# Patient Record
Sex: Male | Born: 1965 | Race: White | Hispanic: No | Marital: Single | Smoking: Never smoker
Health system: Southern US, Community
[De-identification: ages and names within clinical notes are randomized; demographics above are authoritative.]

## PROBLEM LIST (undated history)

## (undated) DIAGNOSIS — M1990 Unspecified osteoarthritis, unspecified site: Secondary | ICD-10-CM

## (undated) HISTORY — PX: ROTATOR CUFF REPAIR: SHX139

## (undated) HISTORY — PX: OTHER SURGICAL HISTORY: SHX169

## (undated) HISTORY — PX: LASIK: SHX215

## (undated) HISTORY — PX: TONSILLECTOMY: SUR1361

---

## 2001-05-03 ENCOUNTER — Emergency Department (HOSPITAL_COMMUNITY): Admission: EM | Admit: 2001-05-03 | Discharge: 2001-05-03 | Payer: Self-pay | Admitting: Emergency Medicine

## 2003-06-12 ENCOUNTER — Emergency Department (HOSPITAL_COMMUNITY): Admission: EM | Admit: 2003-06-12 | Discharge: 2003-06-12 | Payer: Self-pay | Admitting: Emergency Medicine

## 2005-10-20 ENCOUNTER — Encounter: Admission: RE | Admit: 2005-10-20 | Discharge: 2006-01-18 | Payer: Self-pay | Admitting: Physician Assistant

## 2007-06-27 ENCOUNTER — Ambulatory Visit: Payer: Self-pay | Admitting: Family Medicine

## 2007-08-22 HISTORY — PX: LAPAROSCOPIC GASTRIC BANDING: SHX1100

## 2007-10-01 ENCOUNTER — Ambulatory Visit (HOSPITAL_COMMUNITY): Admission: RE | Admit: 2007-10-01 | Discharge: 2007-10-01 | Payer: Self-pay | Admitting: General Surgery

## 2007-11-04 ENCOUNTER — Encounter: Admission: RE | Admit: 2007-11-04 | Discharge: 2007-11-04 | Payer: Self-pay | Admitting: General Surgery

## 2008-02-18 ENCOUNTER — Encounter: Admission: RE | Admit: 2008-02-18 | Discharge: 2008-02-18 | Payer: Self-pay | Admitting: General Surgery

## 2008-03-03 ENCOUNTER — Ambulatory Visit (HOSPITAL_COMMUNITY): Admission: RE | Admit: 2008-03-03 | Discharge: 2008-03-04 | Payer: Self-pay | Admitting: General Surgery

## 2010-01-17 ENCOUNTER — Emergency Department (HOSPITAL_COMMUNITY): Admission: EM | Admit: 2010-01-17 | Discharge: 2010-01-17 | Payer: Self-pay | Admitting: Emergency Medicine

## 2011-01-03 NOTE — Op Note (Signed)
NAME:  CODEN, FRANCHI NO.:  [id number]   MEDICAL RECORD NO.:  [id number]          PATIENT TYPE:  AMB   LOCATION:  DAY                          FACILITY:  [facility]   PHYSICIAN:  Sharlet Salina T. Hoxworth, M.D.DATE OF BIRTH:  12-Sep-1965   DATE OF PROCEDURE:  03/03/2008  DATE OF DISCHARGE:                               OPERATIVE REPORT   PREOPERATIVE DIAGNOSIS:  Morbid obesity.   POSTOPERATIVE DIAGNOSIS:  Morbid obesity.   SURGICAL PROCEDURES:  Placed of laparoscopic adjustable gastric band  with hiatal hernia repair.   SURGEON:  Lorne Skeens. Hoxworth, M.D.   ASSISTANT:  Thornton Park. Daphine Deutscher, M.D.   BRIEF HISTORY:  Mr. Hyndman is a 45 year old male with long history of  progressive morbid obesity unresponsive to medical management.  Following complete discussion workup detailed elsewhere, we elected  proceed with placement laparoscopic adjustable gastric band for  treatment of his morbid obesity.   DESCRIPTION OF OPERATION:  The patient brought to the operating room and  placed supine position on the operating table and general endotracheal  anesthesia was induced.  He received preoperative IV antibiotics.  Subcutaneous heparin was administered preoperatively.  PAS were in  place.  The abdomen was widely sterilely prepped and draped.  Correct  patient and procedure were verified.  Local anesthesia was used to  infiltrate the trocar sites.  Access was obtained in the left subcostal  area in the midclavicular line with an OptiVu trocar without difficulty  and pneumoperitoneum established.  Under direct vision, a 15 mm trocar  was placed in the right upper quadrant laterally and an 11 mm trocar in  the right upper mid abdomen, another 11 mm trocar to the left of the  umbilicus for camera port and 5 mm trocar placed in the left flank.  Through a 5-mm subxiphoid site, the Nathanson's retractor was placed and  the left lobe of liver elevated with excellent exposure of the  stomach  and the hiatus.  Initially the calibration tube was passed into the  stomach and the balloon inflated to 15 mL and pulled back.  This  retracted above a small hiatal hernia which was suspected based on the  upper GI series.  The gastrocolic omentum was divided along an avascular  area and the anterior border of the right crus was identified and  peritoneum opened here and dissected.  The retroesophageal space was  entered and the base of the right, left and crus dissected and the  esophagus and vagus nerve identified and there was a small to moderate-  sized hiatal hernia.  The posterior crural repair was then done with two  interrupted #0 Surgilon sutures.  Following this, the angle of His was  dissected dividing the peritoneum and dissecting along the upper portion  of left crus back posteriorly toward the retrogastric area with the  finger dissector.  Following this, an area just anterior to crossing fat  at the base of the right crus was dissected a little below the hiatal  hernia repair and the finger dissector passed into the space  retrogastric and deployed up through the  previously dissected area at  the angle of His without difficulty.  A flushed AP large lap band system  was introduced and the tubing passed into the finger dissector.  This  was brought back behind the stomach and the band passed back behind the  stomach without difficulty.  With the sizing tube placed back through  the EG junction, the band was snapped into place without any undue  tension.  The sizing tube was removed.  Holding the tubing inferiorly,  the fundus was imbricated up over the band to the small gastric pouch  with three interrupted 2-0 Ethibond sutures.  Band appeared be good  position and rotated freely.  The tubing was brought out through the  right mid abdominal port site and then the Nathanson's retractor removed  under direct vision.  All CO2 evacuated and trocars removed.  Right mid   abdominal incision was lengthened slightly and the anterior fascia  cleared.  Four 2-0 Prolene sutures were placed and then the tubing cut  and attached the port which was sutured to the anterior abdominal wall  with the sutures.  The tubing was seen to curve smoothly into the  abdomen.  The subcu was irrigated and the port site closed with running  3-0 Vicryl and skin was closed with subcuticular Monocryl and Dermabond.  Sponge, needle and instrument counts were correct.  The patient taken to  recovery in good condition.      Lorne Skeens. Hoxworth, M.D.  Electronically Signed     BTH/MEDQ  D:  03/03/2008  T:  03/03/2008  Job:  [id number]

## 2011-05-18 LAB — HEMOGLOBIN AND HEMATOCRIT, BLOOD: HCT: 46.8

## 2011-05-19 LAB — DIFFERENTIAL
Eosinophils Relative: 2
Lymphocytes Relative: 18
Lymphs Abs: 2

## 2011-05-19 LAB — CBC
HCT: 41.6
Platelets: 275
WBC: 11.3 — ABNORMAL HIGH

## 2011-08-16 ENCOUNTER — Ambulatory Visit (INDEPENDENT_AMBULATORY_CARE_PROVIDER_SITE_OTHER): Payer: BC Managed Care – PPO | Admitting: Medical

## 2011-08-16 ENCOUNTER — Encounter: Payer: Self-pay | Admitting: Medical

## 2011-08-16 VITALS — BP 112/80 | HR 62 | Temp 98.0°F | Resp 16 | Wt 279.0 lb

## 2011-08-16 DIAGNOSIS — L03317 Cellulitis of buttock: Secondary | ICD-10-CM

## 2011-08-16 DIAGNOSIS — L0231 Cutaneous abscess of buttock: Secondary | ICD-10-CM | POA: Insufficient documentation

## 2011-08-16 MED ORDER — DOXYCYCLINE HYCLATE 100 MG PO TABS: 100.0000 mg | ORAL_TABLET | Freq: Two times a day (BID) | ORAL | Status: AC

## 2011-08-16 NOTE — Patient Instructions (Signed)

## 2011-08-16 NOTE — Progress Notes (Signed)
Subjective:   HPI  Bobby James is a 45 y.o. male who presents with possible cysts on his buttocks. He note 2 areas that flare up intermittently for the last year or so, occasionally they drain.  He denies hx/o MRSA, and has never been seen for this reason prior.  The lesions have been draining and staining his clothes for the past several days.  No other aggravating or relieving factors.    No other c/o.  The following portions of the patient's history were reviewed and updated as appropriate: allergies, current medications, past family history, past medical history, past social history, past surgical history and problem list.  No significant medical history.   Review of Systems Constitutional: -fever, -chills, -sweats, -unexpected -weight change,-fatigue ENT: -runny nose, -ear pain, -sore throat Cardiology:  -chest pain, -palpitations, -edema Respiratory: -cough, -shortness of breath, -wheezing Gastroenterology: -abdominal pain, -nausea, -vomiting, -diarrhea, -constipation Hematology: -bleeding or bruising problems Musculoskeletal: -arthralgias, -myalgias, -joint swelling, -back pain Ophthalmology: -vision changes Urology: -dysuria, -difficulty urinating, -hematuria, -urinary frequency, -urgency Neurology: -headache, -weakness, -tingling, -numbness    Objective:   Physical Exam  Filed Vitals:   08/16/11 1625  BP: 112/80  Pulse: 62  Temp: 98 F (36.7 C)  Resp: 16    General appearance: alert, no distress, WD/WN Skin: right buttock with 2 lesions along gluteal cleft, 1 lesion is 3cm x 1 cm with erythema and pus draining at current, but no warmth or induration, and second smaller 1cm diameter lesion that is slightly erythematous, but no induration, fluctuance or warmth  Assessment and Plan :    Encounter Diagnosis  Name Primary?  Marland Kitchen Abscess of buttock Yes   The 2 lesions are small and current draining.  Advised hot epsom salt bath soaks, practice good hygiene, begin  Doxycycline, and recheck in 10-12 days, sooner prn.

## 2011-09-21 ENCOUNTER — Telehealth (INDEPENDENT_AMBULATORY_CARE_PROVIDER_SITE_OTHER): Payer: Self-pay | Admitting: General Surgery

## 2011-09-21 NOTE — Telephone Encounter (Signed)
09/21/11 recall letter mailed to patient for bariatric surgery follow-up. Adv pt to call our office at [phone] to schedule an appointment. CEF °

## 2012-03-01 ENCOUNTER — Encounter (INDEPENDENT_AMBULATORY_CARE_PROVIDER_SITE_OTHER): Payer: Self-pay | Admitting: General Surgery

## 2012-03-01 ENCOUNTER — Ambulatory Visit (INDEPENDENT_AMBULATORY_CARE_PROVIDER_SITE_OTHER): Payer: BC Managed Care – PPO | Admitting: General Surgery

## 2012-03-01 VITALS — BP 136/84 | HR 71 | Temp 97.2°F | Resp 16 | Ht 69.0 in | Wt 279.6 lb

## 2012-03-01 DIAGNOSIS — L02219 Cutaneous abscess of trunk, unspecified: Secondary | ICD-10-CM

## 2012-03-01 DIAGNOSIS — L02215 Cutaneous abscess of perineum: Secondary | ICD-10-CM

## 2012-05-13 ENCOUNTER — Encounter (HOSPITAL_COMMUNITY): Payer: Self-pay | Admitting: Pharmacy Technician

## 2012-05-21 ENCOUNTER — Encounter (HOSPITAL_COMMUNITY): Payer: Self-pay

## 2012-05-21 ENCOUNTER — Encounter (HOSPITAL_COMMUNITY)
Admission: RE | Admit: 2012-05-21 | Discharge: 2012-05-21 | Disposition: A | Discharge status: Home / Self Care | Payer: BC Managed Care – PPO | Source: Ambulatory Visit | Attending: General Surgery | Admitting: General Surgery

## 2012-05-21 LAB — CBC
Hemoglobin: 15.6 g/dL (ref 13.0–17.0)
MCH: 28.1 pg (ref 26.0–34.0)
RBC: 5.56 MIL/uL (ref 4.22–5.81)
WBC: 9.7 10*3/uL (ref 4.0–10.5)

## 2012-05-21 LAB — SURGICAL PCR SCREEN
MRSA, PCR: NEGATIVE
Staphylococcus aureus: POSITIVE — AB

## 2012-05-21 NOTE — Patient Instructions (Signed)
20      Your procedure is scheduled on:  Wednesday 05/22/2012 at 0830 am  Report to [facility] at 0630 AM.  Call this number if you have problems the morning of surgery: [phone]   Remember:   Do not eat food or drink liquids after midnight!  Take these medicines the morning of surgery with A SIP OF WATER: NONE   Do not bring valuables to the hospital.  .  Leave suitcase in the car. After surgery it may be brought to your room.  For patients admitted to the hospital, checkout time is 11:00 AM the day of              Discharge.    Special Instructions: See [facility] Preparing  For Surgery Instruction Sheet.  Do not wear jewelry, lotions powders, perfumes. Women do not shave  legs or underarms for 12 hours before showers. Contacts, partial plates, or dentures may not be worn into surgery.                          Patients discharged the day of surgery will not be allowed to drive home.  If going home the same day of surgery, must have someone stay with you first 24 hrs.at home and arrange for someone to drive you home from the              Hospital.   Please read over the following fact sheets that you were given: MRSA              INFORMATION, Sleep apnea sheet, Incentive Spirometry sheet               Telford Nab.Eleyna Brugh,RN,BSN [phone]

## 2012-05-22 ENCOUNTER — Encounter (HOSPITAL_COMMUNITY)
Admission: RE | Disposition: A | Discharge status: Home / Self Care | Payer: Self-pay | Source: Ambulatory Visit | Attending: General Surgery

## 2012-05-22 ENCOUNTER — Encounter (HOSPITAL_COMMUNITY): Payer: Self-pay | Admitting: Anesthesiology

## 2012-05-22 ENCOUNTER — Encounter (HOSPITAL_COMMUNITY): Payer: Self-pay | Admitting: *Deleted

## 2012-05-22 ENCOUNTER — Observation Stay (HOSPITAL_COMMUNITY)
Admission: RE | Admit: 2012-05-22 | Discharge: 2012-05-23 | Disposition: A | Discharge status: Home / Self Care | Payer: BC Managed Care – PPO | Source: Ambulatory Visit | Attending: General Surgery | Admitting: General Surgery

## 2012-05-22 ENCOUNTER — Ambulatory Visit (HOSPITAL_COMMUNITY): Payer: BC Managed Care – PPO | Admitting: Anesthesiology

## 2012-05-22 DIAGNOSIS — L0231 Cutaneous abscess of buttock: Secondary | ICD-10-CM

## 2012-05-22 DIAGNOSIS — Z9884 Bariatric surgery status: Secondary | ICD-10-CM | POA: Insufficient documentation

## 2012-05-22 DIAGNOSIS — L02219 Cutaneous abscess of trunk, unspecified: Principal | ICD-10-CM | POA: Insufficient documentation

## 2012-05-22 DIAGNOSIS — Z01812 Encounter for preprocedural laboratory examination: Secondary | ICD-10-CM | POA: Insufficient documentation

## 2012-05-22 DIAGNOSIS — L723 Sebaceous cyst: Secondary | ICD-10-CM

## 2012-05-22 DIAGNOSIS — L03319 Cellulitis of trunk, unspecified: Secondary | ICD-10-CM | POA: Insufficient documentation

## 2012-05-22 DIAGNOSIS — K612 Anorectal abscess: Secondary | ICD-10-CM

## 2012-05-22 HISTORY — PX: INCISION AND DRAINAGE PERIRECTAL ABSCESS: SHX1804

## 2012-05-22 SURGERY — INCISION AND DRAINAGE, ABSCESS, PERIRECTAL
Anesthesia: General | Site: Anus | Wound class: Dirty or Infected

## 2012-05-22 MED ORDER — ACETAMINOPHEN 10 MG/ML IV SOLN
INTRAVENOUS | Status: DC | PRN
  Administered 2012-05-22: 1000 mg via INTRAVENOUS

## 2012-05-22 MED ORDER — CEFAZOLIN SODIUM-DEXTROSE 2-3 GM-% IV SOLR
INTRAVENOUS | Status: AC
  Filled 2012-05-22: qty 50

## 2012-05-22 MED ORDER — FENTANYL CITRATE 0.05 MG/ML IJ SOLN
INTRAMUSCULAR | Status: DC | PRN
  Administered 2012-05-22: 100 ug via INTRAVENOUS

## 2012-05-22 MED ORDER — POTASSIUM CHLORIDE IN NACL 20-0.9 MEQ/L-% IV SOLN
INTRAVENOUS | Status: DC
  Administered 2012-05-22: 1000 mL via INTRAVENOUS
  Administered 2012-05-23: 06:00:00 via INTRAVENOUS
  Filled 2012-05-22 (×2): qty 1000

## 2012-05-22 MED ORDER — BUPIVACAINE LIPOSOME 1.3 % IJ SUSP
20.0000 mL | Freq: Once | INTRAMUSCULAR | Status: DC
  Filled 2012-05-22: qty 20

## 2012-05-22 MED ORDER — HEPARIN SODIUM (PORCINE) 5000 UNIT/ML IJ SOLN
5000.0000 [IU] | Freq: Three times a day (TID) | INTRAMUSCULAR | Status: DC
  Administered 2012-05-22 – 2012-05-23 (×2): 5000 [IU] via SUBCUTANEOUS
  Filled 2012-05-22 (×5): qty 1

## 2012-05-22 MED ORDER — DIBUCAINE 1 % RE OINT
TOPICAL_OINTMENT | RECTAL | Status: AC
  Filled 2012-05-22: qty 28

## 2012-05-22 MED ORDER — ONDANSETRON HCL 4 MG/2ML IJ SOLN
INTRAMUSCULAR | Status: DC | PRN
  Administered 2012-05-22: 4 mg via INTRAVENOUS

## 2012-05-22 MED ORDER — DEXTROSE 5 % IV SOLN
3.0000 g | INTRAVENOUS | Status: AC
  Administered 2012-05-22: 3 g via INTRAVENOUS

## 2012-05-22 MED ORDER — ONDANSETRON HCL 4 MG PO TABS: 4.0000 mg | ORAL_TABLET | Freq: Four times a day (QID) | ORAL | Status: DC | PRN

## 2012-05-22 MED ORDER — OXYCODONE-ACETAMINOPHEN 5-325 MG PO TABS
1.0000 | ORAL_TABLET | ORAL | Status: DC | PRN
  Administered 2012-05-23: 2 via ORAL
  Filled 2012-05-22: qty 2

## 2012-05-22 MED ORDER — PROMETHAZINE HCL 25 MG/ML IJ SOLN: 6.2500 mg | INTRAMUSCULAR | Status: DC | PRN

## 2012-05-22 MED ORDER — FENTANYL CITRATE 0.05 MG/ML IJ SOLN
INTRAMUSCULAR | Status: AC
  Filled 2012-05-22: qty 2

## 2012-05-22 MED ORDER — MORPHINE SULFATE 2 MG/ML IJ SOLN
2.0000 mg | INTRAMUSCULAR | Status: DC | PRN
  Administered 2012-05-22: 4 mg via INTRAVENOUS
  Administered 2012-05-22: 2 mg via INTRAVENOUS
  Administered 2012-05-22: 4 mg via INTRAVENOUS
  Filled 2012-05-22: qty 2
  Filled 2012-05-22: qty 1
  Filled 2012-05-22: qty 2

## 2012-05-22 MED ORDER — BUPIVACAINE LIPOSOME 1.3 % IJ SUSP
INTRAMUSCULAR | Status: DC | PRN
  Administered 2012-05-22: 20 mL

## 2012-05-22 MED ORDER — KETOROLAC TROMETHAMINE 30 MG/ML IJ SOLN: 15.0000 mg | Freq: Once | INTRAMUSCULAR | Status: DC | PRN

## 2012-05-22 MED ORDER — LACTATED RINGERS IV SOLN
INTRAVENOUS | Status: DC | PRN
  Administered 2012-05-22: 08:00:00 via INTRAVENOUS

## 2012-05-22 MED ORDER — FENTANYL CITRATE 0.05 MG/ML IJ SOLN
25.0000 ug | INTRAMUSCULAR | Status: DC | PRN
  Administered 2012-05-22 (×3): 50 ug via INTRAVENOUS

## 2012-05-22 MED ORDER — CEFAZOLIN SODIUM 1-5 GM-% IV SOLN
INTRAVENOUS | Status: AC
  Filled 2012-05-22: qty 50

## 2012-05-22 MED ORDER — ACETAMINOPHEN 10 MG/ML IV SOLN
INTRAVENOUS | Status: AC
  Filled 2012-05-22: qty 100

## 2012-05-22 MED ORDER — MUPIROCIN 2 % EX OINT
TOPICAL_OINTMENT | Freq: Two times a day (BID) | CUTANEOUS | Status: DC
  Administered 2012-05-22: 1 via NASAL
  Filled 2012-05-22 (×2): qty 22

## 2012-05-22 MED ORDER — BUPIVACAINE-EPINEPHRINE PF 0.25-1:200000 % IJ SOLN
INTRAMUSCULAR | Status: AC
  Filled 2012-05-22: qty 30

## 2012-05-22 MED ORDER — POTASSIUM CHLORIDE IN NACL 20-0.9 MEQ/L-% IV SOLN
INTRAVENOUS | Status: AC
  Filled 2012-05-22: qty 1000

## 2012-05-22 MED ORDER — INFLUENZA VIRUS VACC SPLIT PF IM SUSP
0.5000 mL | INTRAMUSCULAR | Status: AC
  Administered 2012-05-23: 0.5 mL via INTRAMUSCULAR
  Filled 2012-05-22: qty 0.5

## 2012-05-22 MED ORDER — LIDOCAINE HCL (CARDIAC) 20 MG/ML IV SOLN
INTRAVENOUS | Status: DC | PRN
  Administered 2012-05-22: 50 mg via INTRAVENOUS

## 2012-05-22 MED ORDER — PROPOFOL 10 MG/ML IV BOLUS
INTRAVENOUS | Status: DC | PRN
  Administered 2012-05-22: 180 mg via INTRAVENOUS

## 2012-05-22 MED ORDER — ONDANSETRON HCL 4 MG/2ML IJ SOLN: 4.0000 mg | Freq: Four times a day (QID) | INTRAMUSCULAR | Status: DC | PRN

## 2012-05-22 SURGICAL SUPPLY — 30 items
BLADE HEX COATED 2.75 (ELECTRODE) ×2 IMPLANT
BLADE SURG 15 STRL LF DISP TIS (BLADE) ×1 IMPLANT
BLADE SURG 15 STRL SS (BLADE) ×1
CANISTER SUCTION 2500CC (MISCELLANEOUS) ×2 IMPLANT
CLOTH BEACON ORANGE TIMEOUT ST (SAFETY) ×2 IMPLANT
COVER SURGICAL LIGHT HANDLE (MISCELLANEOUS) IMPLANT
DRSG PAD ABDOMINAL 8X10 ST (GAUZE/BANDAGES/DRESSINGS) ×2 IMPLANT
ELECT REM PT RETURN 9FT ADLT (ELECTROSURGICAL) ×2
ELECTRODE REM PT RTRN 9FT ADLT (ELECTROSURGICAL) ×1 IMPLANT
GAUZE SPONGE 4X4 16PLY XRAY LF (GAUZE/BANDAGES/DRESSINGS) ×2 IMPLANT
GLOVE BIOGEL PI IND STRL 7.0 (GLOVE) ×3 IMPLANT
GLOVE BIOGEL PI INDICATOR 7.0 (GLOVE) ×3
GOWN STRL NON-REIN LRG LVL3 (GOWN DISPOSABLE) ×2 IMPLANT
GOWN STRL REIN XL XLG (GOWN DISPOSABLE) ×2 IMPLANT
KIT BASIN OR (CUSTOM PROCEDURE TRAY) ×2 IMPLANT
LUBRICANT JELLY K Y 4OZ (MISCELLANEOUS) ×2 IMPLANT
NEEDLE HYPO 25X1 1.5 SAFETY (NEEDLE) IMPLANT
PACK LITHOTOMY IV (CUSTOM PROCEDURE TRAY) ×2 IMPLANT
PENCIL BUTTON HOLSTER BLD 10FT (ELECTRODE) ×2 IMPLANT
SOL PREP PROV IODINE SCRUB 4OZ (MISCELLANEOUS) ×2 IMPLANT
SPONGE GAUZE 4X4 12PLY (GAUZE/BANDAGES/DRESSINGS) IMPLANT
SPONGE LAP 18X18 X RAY DECT (DISPOSABLE) ×2 IMPLANT
SPONGE LAP 4X18 X RAY DECT (DISPOSABLE) ×2 IMPLANT
SUT ETHILON 4 0 PS 2 18 (SUTURE) ×2 IMPLANT
SUT VIC AB 4-0 SH 18 (SUTURE) ×2 IMPLANT
SWAB COLLECTION DEVICE MRSA (MISCELLANEOUS) IMPLANT
SYR CONTROL 10ML LL (SYRINGE) ×2 IMPLANT
TOWEL OR 17X26 10 PK STRL BLUE (TOWEL DISPOSABLE) ×2 IMPLANT
UNDERPAD 30X30 INCONTINENT (UNDERPADS AND DIAPERS) ×2 IMPLANT
YANKAUER SUCT BULB TIP 10FT TU (MISCELLANEOUS) ×2 IMPLANT

## 2012-05-22 NOTE — H&P (Signed)
  Subjective:   Chronic infection and drainage of perineum  Patient ID: Bobby James, male DOB: 16-Mar-1966, 46 y.o. MRN: [id number]  HPI  Patient well known to me from LAP-BAND placement in 2009 is referred do to a chronic infection on his perineum. He states that for at least a year and a half he has had intermittent swelling and discomfort as well as bloody and purulent drainage from the right side of his perineum extending from the scrotum back toward the anus. This is been treated with multiple courses of antibiotics and topical care with just gradual worsening of the process. It causes daily drainage and occasional discomfort. He has not had any previous surgical procedures in the area. He has no specific anorectal symptoms.  No past medical history on file.  Past Surgical History   Procedure  Date   .  Laparoscopic gastric banding  2009    No current outpatient prescriptions on file.    No Known Allergies  Review of Systems  Constitutional: Negative.  Respiratory: Negative.  Cardiovascular: Negative.  Gastrointestinal: Negative.    Objective:   Physical Exam  General: Overweight Caucasian male in no distress  Skin: Warm and dry. see perineum  Lungs: Clear without wheezing or increased work of breathing  Cardiac: Regular rate and rhythm without murmurs. No edema  Abdomen: Soft and nontender. Status post lap band port site unremarkable.  GU/perineum: To the right side of the perineum extending anterior to posterior from the base of the scrotum to the right perianal area is a 15 cm area of induration tubular shaped with several chronic draining sinus tracts to the skin. There does not appear to be any extension over toward the anus.  Extremities: No joint swelling deformity or edema  Neurologic: Alert and oriented. Gait normal.   Assessment:    Chronic perineal abscess and sinus tract. 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 We discussed surgical options. This whole tract would have to be completely excised which measures 15 cm in length and would be a large wound with some significant initial disability and healing time. We discussed the wound would need to be left open. We discussed extensive nature of the surgery and risks of bleeding, infection, delayed healing, and recurrent infection. The patient feels he really cannot continue like he is desirous to proceed with surgical intervention.   Plan:    Excision of chronic perineal abscess under general anesthesia with overnight hospitalization.

## 2012-05-22 NOTE — Progress Notes (Signed)
Vitals done at 1310

## 2012-05-22 NOTE — Anesthesia Preprocedure Evaluation (Signed)
Anesthesia Evaluation  Patient identified by MRN, date of birth, ID band Patient awake    Reviewed: Allergy & Precautions, H&P , NPO status , Patient's Chart, lab work & pertinent test results  Airway Mallampati: III TM Distance: <3 FB Neck ROM: Full    Dental No notable dental hx.    Pulmonary neg pulmonary ROS,  breath sounds clear to auscultation  + decreased breath sounds      Cardiovascular negative cardio ROS  Rhythm:Regular Rate:Normal     Neuro/Psych negative neurological ROS  negative psych ROS   GI/Hepatic negative GI ROS, Neg liver ROS,   Endo/Other  Morbid obesity  Renal/GU negative Renal ROS  negative genitourinary   Musculoskeletal negative musculoskeletal ROS (+)   Abdominal   Peds negative pediatric ROS (+)  Hematology negative hematology ROS (+)   Anesthesia Other Findings   Reproductive/Obstetrics negative OB ROS                           Anesthesia Physical Anesthesia Plan  ASA: III  Anesthesia Plan: General   Post-op Pain Management:    Induction: Intravenous  Airway Management Planned: LMA and Oral ETT  Additional Equipment:   Intra-op Plan:   Post-operative Plan:   Informed Consent: I have reviewed the patients History and Physical, chart, labs and discussed the procedure including the risks, benefits and alternatives for the proposed anesthesia with the patient or authorized representative who has indicated his/her understanding and acceptance.   Dental advisory given  Plan Discussed with: CRNA and Surgeon  Anesthesia Plan Comments:         Anesthesia Quick Evaluation

## 2012-05-22 NOTE — Transfer of Care (Signed)
Immediate Anesthesia Transfer of Care Note  Patient: Bobby James  Procedure(s) Performed: Procedure(s) (LRB) with comments: IRRIGATION AND DEBRIDEMENT PERIRECTAL ABSCESS (N/A) - Excision Perineal Abscess  Patient Location: PACU  Anesthesia Type: General  Level of Consciousness: awake, alert  and oriented  Airway & Oxygen Therapy: Patient Spontanous Breathing and Patient connected to face mask oxygen  Post-op Assessment: Report given to PACU RN  Post vital signs: Reviewed and stable  Complications: No apparent anesthesia complications

## 2012-05-22 NOTE — Op Note (Signed)
Preoperative Diagnosis: perineal abscess  Postoprative Diagnosis: perineal abscess  Procedure: Procedure(s): EXCISION CHRONIC PERINEAL ABSCESS   Surgeon: Glenna Fellows T   Assistants: None  Anesthesia:  General endotracheal anesthesia  Indications:   Patient is a 46 year old male with a long history of a persistent area of purulent drainage and discomfort on the right side of his perineum. Examination has revealed a large area of chronic soft tissue infection extending from the base of the scrotum down the right side of the perineum from anterior and posterior to the posterior right buttock. There are several draining sinus tracts with purulent drainage and a sausage-shaped area of induration which measures approximately 12 x 5 cm. This is consistent with hidradenitis and does not seem to extend toward the anus. Due to persistent and worsening symptoms after discussion of options in the office we elected to proceed with excision of this area. We discussed that the wound will be left open. Complications including anesthetic complications, bleeding, infection, delayed wound healing and recurrence have been discussed and understood.  Procedure Detail:  Patient is brought to the operating room, placed in supine position on the operating table, and general ventricular anesthesia was induced. He was carefully positioned in lithotomy position and the perineum widely sterilely prepped and draped. He received preoperative IV antibiotics. Correct patient and procedure were verified. Examination was as above. I passed a probe into several of the sinus tracts and none of them appeared to communicate over toward the anus at all down into this complex chronic abscess. An elliptical excision was performed anterior to posterior encompassing all the sinus tracts indurated area. This extended into the deep subcutaneous tissue and down to the fascia at the base of the scrotum. A couple of areas superiorly and  medially I did come across the abscess cavity with cautery and this was well-defined and thick walled with granulation tissue. I went back and completely excised this back to healthy subcutaneous tissue. The entire process was completely removed. Hemostasis was obtained with cautery and several figure-of-eight sutures of 4-0 Vicryl. The soft tissue was infiltrated with 20 cc of Exparel local anesthetic. I then partially closed the wound with 2 mattress sutures of interrupted 4-0 nylon creating basically free open wounds which were then packed with moist saline gauze. Sponge needle was were correct. Drawstring dressing was applied.   Estimated Blood Loss:  less than 50 mL                 Specimens: the skin and subcutaneous tissue containing chronic abscess cavity        Complications:  * No complications entered in OR log *         Disposition: PACU - hemodynamically stable.         Condition: stable Mariella Saa MD, FACS  05/22/2012, 9:34 AM

## 2012-05-22 NOTE — Anesthesia Procedure Notes (Signed)
Procedure Name: LMA Insertion Date/Time: 05/22/2012 8:40 AM Performed by: Hulan Fess Pre-anesthesia Checklist: Patient identified, Emergency Drugs available, Suction available, Patient being monitored and Timeout performed Patient Re-evaluated:Patient Re-evaluated prior to inductionOxygen Delivery Method: Circle system utilized and Simple face mask Preoxygenation: Pre-oxygenation with 100% oxygen Intubation Type: IV induction Ventilation: Mask ventilation without difficulty LMA: LMA with gastric port inserted LMA Size: 4.0

## 2012-05-22 NOTE — Anesthesia Postprocedure Evaluation (Signed)
  Anesthesia Post-op Note  Patient: Bobby James  Procedure(s) Performed: Procedure(s) (LRB): IRRIGATION AND DEBRIDEMENT PERIRECTAL ABSCESS (N/A)  Patient Location: PACU  Anesthesia Type: General  Level of Consciousness: awake and alert   Airway and Oxygen Therapy: Patient Spontanous Breathing  Post-op Pain: mild  Post-op Assessment: Post-op Vital signs reviewed, Patient's Cardiovascular Status Stable, Respiratory Function Stable, Patent Airway and No signs of Nausea or vomiting  Post-op Vital Signs: stable  Complications: No apparent anesthesia complications

## 2012-05-23 ENCOUNTER — Encounter (HOSPITAL_COMMUNITY): Payer: Self-pay | Admitting: General Surgery

## 2012-05-23 LAB — CBC
MCH: 29.1 pg (ref 26.0–34.0)
MCV: 82.1 fL (ref 78.0–100.0)
Platelets: 252 10*3/uL (ref 150–400)
RDW: 12.6 % (ref 11.5–15.5)
WBC: 10.2 10*3/uL (ref 4.0–10.5)

## 2012-05-23 LAB — BASIC METABOLIC PANEL
Calcium: 8.7 mg/dL (ref 8.4–10.5)
Creatinine, Ser: 1 mg/dL (ref 0.50–1.35)
GFR calc Af Amer: >90 mL/min (ref >90)
GFR calc non Af Amer: 87 mL/min — ABNORMAL LOW (ref >90)

## 2012-05-23 MED ORDER — OXYCODONE-ACETAMINOPHEN 5-325 MG PO TABS: 1.0000 | ORAL_TABLET | ORAL | Status: DC | PRN

## 2012-05-23 NOTE — Discharge Summary (Signed)
   Patient ID: ESCHER HARR [id number] 46 y.o. 1966/03/22  05/22/2012  Discharge date and time: 05/23/2012   Admitting Physician: Glenna Fellows T  Discharge Physician: Glenna Fellows T  Admission Diagnoses: perineal abscess  Discharge Diagnoses: Same  Operations: Procedure(s): IRRIGATION AND DEBRIDEMENT PERIRECTAL ABSCESS  Admission Condition: good  Discharged Condition: good  Indication for Admission: patient has a long history of gradually enlarging persistent area of induration with multiple draining sinuses along the right side of his perineum from the base of the scrotum to the right perirectal area. This does not appear to communicate with the anus. After extensive discussion about options and risks detailed elsewhere he is admitted for excision of his chronic perineal abscess  Hospital Course: on the morning of admission the patient underwent excision of a 15 x 5 cm chronic abscess of the perineum with multiple fistulas to the skin which appeared most consistent with chronic retinitis. There was no apparent communication to the rectum. Postoperatively he did well without much pain. He tolerated his wound repacking on the first postop day without difficulty and his wound was clean and without bleeding. He will return to the office tomorrow for repacking.   Disposition: Home  Patient Instructions:   [place], [place]  Home Medication Instructions [id number]   Printed on:05/23/12 9562  Medication Information                    oxyCODONE-acetaminophen (PERCOCET/ROXICET) 5-325 MG per tablet Take 1-2 tablets by mouth every 4 (four) hours as needed.             Activity: activity as tolerated Diet: LAP-BAND diet Wound Care: as directed   Follow-up:  With Dr. Johna Sheriff in 1 day.  Signed: Mariella Saa MD, FACS  05/23/2012, 7:17 AM

## 2012-05-23 NOTE — Progress Notes (Signed)
Dr. Johna Sheriff aware via phone pt's  packing came out while preparing to go home. Order received to replace packing prior to dc.

## 2012-05-23 NOTE — Care Management Note (Signed)
    Page 1 of 1   05/23/2012     1:35:27 PM   CARE MANAGEMENT NOTE 05/23/2012  Patient:  Bobby James, Bobby James   Account Number:  [id number]  Date Initiated:  05/23/2012  Documentation initiated by:  Lorenda Ishihara  Subjective/Objective Assessment:   46 yo male admitted with perineal abscess, I&D in OR. PTA lived at home with spouse.     Action/Plan:   Anticipated DC Date:  05/23/2012   Anticipated DC Plan:  HOME/SELF CARE      DC Planning Services  CM consult      Choice offered to / List presented to:             Status of service:  Completed, signed off Medicare Important Message given?   (If response is "NO", the following Medicare IM given date fields will be blank) Date Medicare IM given:   Date Additional Medicare IM given:    Discharge Disposition:  HOME/SELF CARE  Per UR Regulation:  Reviewed for med. necessity/level of care/duration of stay  If discussed at Long Length of Stay Meetings, dates discussed:    Comments:

## 2012-05-23 NOTE — Progress Notes (Signed)
Assessment unchanged.  IV removed.  Discussed D/C instructions with pt including f/u appointment and wound care instructions. Pt verbalized understanding.  Paperwork and Rx given to pt.  Pt did not have any questions.  Pt left via W/C accompanied by NT.

## 2012-05-23 NOTE — Progress Notes (Signed)
Patient ID: Bobby James, male   DOB: 1966-08-12, 46 y.o.   MRN: [id number] 1 Day Post-Op  Subjective: No complaints. Minimal discomfort.  Objective: Vital signs in last 24 hours: Temp:  [97.5 F (36.4 C)-98.9 F (37.2 C)] 98.5 F (36.9 C) (10/03 0530) Pulse Rate:  [56-81] 65  (10/03 0530) Resp:  [11-21] 18  (10/03 0530) BP: (126-176)/(57-107) 129/68 mmHg (10/03 0530) SpO2:  [95 %-100 %] 95 % (10/03 0530) Weight:  [279 lb (126.554 kg)] 279 lb (126.554 kg) (10/02 1210) Last BM Date: 05/22/12  Intake/Output from previous day: 10/02 0701 - 10/03 0700 In: 2259.2 [P.O.:240; I.V.:2019.2] Out: 1250 [Urine:1250] Intake/Output this shift: Total I/O In: 969.2 [I.V.:969.2] Out: 400 [Urine:400]  General appearance: alert and no distress Incision/Wound: clean and no bleeding.  Lab Results:   Basename 05/23/12 0445 05/21/12 0910  WBC 10.2 9.7  HGB 14.6 15.6  HCT 41.2 45.1  PLT 252 295   BMET No results found for this basename: NA:2,K:2,CL:2,CO2:2,GLUCOSE:2,BUN:2,CREATININE:2,CALCIUM:2 in the last 72 hours   Studies/Results: No results found.  Anti-infectives: Anti-infectives     Start     Dose/Rate Route Frequency Ordered Stop   05/22/12 0708   ceFAZolin (ANCEF) 3 g in dextrose 5 % 50 mL IVPB        3 g 160 mL/hr over 30 Minutes Intravenous 60 min pre-op 05/22/12 0708 05/22/12 0830          Assessment/Plan: s/p Procedure(s): EXCISION OF PERINEAL ABSCESS Wound repacked. Doing well OK for discharge, F/U office tomorrow for wound packing   LOS: 1 day    Enna Warwick T 05/23/2012

## 2012-05-24 ENCOUNTER — Ambulatory Visit (INDEPENDENT_AMBULATORY_CARE_PROVIDER_SITE_OTHER): Payer: BC Managed Care – PPO | Admitting: General Surgery

## 2012-05-24 VITALS — BP 148/88 | HR 82 | Temp 97.5°F | Resp 18 | Ht 69.0 in | Wt 281.4 lb

## 2012-05-24 DIAGNOSIS — L0231 Cutaneous abscess of buttock: Secondary | ICD-10-CM

## 2012-05-24 NOTE — Progress Notes (Signed)
History: Patient returns several days following removal of an extensive chronic subcutaneous abscess of the perineum probably secondary to hidradenitis. The wound was loosely closed with 2 nylon sutures and the rest packed open. He comes in for a dressing change. He several remarkably little pain and no other complaints.  Exam: The wound which overall measures about 15 cm in length in the right side of the perineum is clean without unusual drainage or bleeding. It was repacked. He will remove the packing tomorrow and then just begin moist saline dressing changes and keep the wound covered and clean. The 2 nylon sutures will be removed at the end of next week.

## 2012-05-30 ENCOUNTER — Ambulatory Visit (INDEPENDENT_AMBULATORY_CARE_PROVIDER_SITE_OTHER): Payer: BC Managed Care – PPO | Admitting: General Surgery

## 2012-05-30 ENCOUNTER — Encounter (INDEPENDENT_AMBULATORY_CARE_PROVIDER_SITE_OTHER): Payer: Self-pay

## 2012-05-30 VITALS — BP 106/78 | HR 87 | Temp 97.4°F | Resp 16 | Ht 69.0 in | Wt 279.4 lb

## 2012-05-30 DIAGNOSIS — Z4802 Encounter for removal of sutures: Secondary | ICD-10-CM

## 2012-05-30 NOTE — Progress Notes (Signed)
Patient came in for suture removal.  Patient s/p Perineal abscess excision on 05/22/12.  Sutures removed, dry gauze placed over wound.  Patient  Seen in office on 05/24/12 with Dr. Johna Sheriff and advised to come in on 05/30/12 for nurse visit.      Dr. Abbey Chatters examined wound prior to suture removal, advised to remove remaining sutures and placed dry gauze over wound.  Patient has follow up on 06/14/12 w/Dr. Owens Shark.  Nurse visit with Maryan Puls & June Leap.

## 2012-06-14 ENCOUNTER — Encounter (INDEPENDENT_AMBULATORY_CARE_PROVIDER_SITE_OTHER): Payer: BC Managed Care – PPO | Admitting: General Surgery

## 2012-08-02 ENCOUNTER — Ambulatory Visit (INDEPENDENT_AMBULATORY_CARE_PROVIDER_SITE_OTHER): Payer: BC Managed Care – PPO | Admitting: General Surgery

## 2012-08-02 ENCOUNTER — Encounter (INDEPENDENT_AMBULATORY_CARE_PROVIDER_SITE_OTHER): Payer: Self-pay | Admitting: General Surgery

## 2012-08-02 VITALS — BP 132/76 | HR 76 | Temp 98.2°F | Resp 18 | Ht 69.0 in | Wt 275.6 lb

## 2012-08-02 DIAGNOSIS — Z09 Encounter for follow-up examination after completed treatment for conditions other than malignant neoplasm: Secondary | ICD-10-CM

## 2012-08-02 NOTE — Progress Notes (Signed)
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 He happily reports it is completely healed.  In regards to his lap band he's been exercising we'll do more and still feels significant restriction and his weight is actually down approximately 11 pounds from several months ago.  Exam: BP 132/76  Pulse 76  Temp 98.2 F (36.8 C) (Temporal)  Resp 18  Ht 5\' 9"  (1.753 m)  Wt 275 lb 9.6 oz (125.011 kg)  BMI 40.70 kg/m2 Total weight loss and 50 pounds General: Appears well Perineum: The incision is completely healed without any skin openings were induration or other concerns.  Assessment and plan: Doing well with healed wound post abscess excision. He's doing well with his lap band and is planning on increasing his exercise regimen. I asked him to call in about 6 months for followup for his lap band.

## 2013-07-03 ENCOUNTER — Encounter: Payer: Self-pay | Admitting: Internal Medicine

## 2016-10-02 ENCOUNTER — Encounter (HOSPITAL_COMMUNITY): Payer: Self-pay

## 2017-10-05 ENCOUNTER — Encounter (HOSPITAL_COMMUNITY): Payer: Self-pay

## 2020-03-30 ENCOUNTER — Other Ambulatory Visit: Payer: Self-pay | Admitting: Sports Medicine

## 2020-03-30 DIAGNOSIS — M25561 Pain in right knee: Secondary | ICD-10-CM

## 2020-04-29 ENCOUNTER — Other Ambulatory Visit: Payer: Self-pay

## 2020-04-29 ENCOUNTER — Ambulatory Visit
Admission: RE | Admit: 2020-04-29 | Discharge: 2020-04-29 | Disposition: A | Discharge status: Home / Self Care | Payer: 59 | Source: Ambulatory Visit | Attending: Sports Medicine | Admitting: Sports Medicine

## 2020-04-29 DIAGNOSIS — M25561 Pain in right knee: Secondary | ICD-10-CM

## 2020-07-07 DIAGNOSIS — M1711 Unilateral primary osteoarthritis, right knee: Secondary | ICD-10-CM | POA: Diagnosis present

## 2020-07-14 ENCOUNTER — Encounter (HOSPITAL_COMMUNITY)
Admission: RE | Admit: 2020-07-14 | Discharge: 2020-07-14 | Disposition: A | Discharge status: Home / Self Care | Payer: 59 | Source: Ambulatory Visit | Attending: Orthopedic Surgery | Admitting: Orthopedic Surgery

## 2020-07-14 ENCOUNTER — Other Ambulatory Visit: Payer: Self-pay

## 2020-07-14 ENCOUNTER — Encounter (HOSPITAL_COMMUNITY): Payer: Self-pay

## 2020-07-14 DIAGNOSIS — Z01812 Encounter for preprocedural laboratory examination: Secondary | ICD-10-CM | POA: Insufficient documentation

## 2020-07-14 HISTORY — DX: Unspecified osteoarthritis, unspecified site: M19.90

## 2020-07-14 LAB — CBC
HCT: 43.5 % (ref 39.0–52.0)
Hemoglobin: 14.5 g/dL (ref 13.0–17.0)
MCH: 28.3 pg (ref 26.0–34.0)
MCHC: 33.3 g/dL (ref 30.0–36.0)
MCV: 84.8 fL (ref 80.0–100.0)
Platelets: 260 10*3/uL (ref 150–400)
RBC: 5.13 MIL/uL (ref 4.22–5.81)
RDW: 13.2 % (ref 11.5–15.5)
WBC: 11.1 10*3/uL — ABNORMAL HIGH (ref 4.0–10.5)
nRBC: 0 % (ref 0.0–0.2)

## 2020-07-14 LAB — SURGICAL PCR SCREEN
MRSA, PCR: NEGATIVE
Staphylococcus aureus: NEGATIVE

## 2020-07-14 LAB — BASIC METABOLIC PANEL
Anion gap: 8 (ref 5–15)
BUN: 22 mg/dL — ABNORMAL HIGH (ref 6–20)
CO2: 24 mmol/L (ref 22–32)
Calcium: 9 mg/dL (ref 8.9–10.3)
Chloride: 107 mmol/L (ref 98–111)
Creatinine, Ser: 0.88 mg/dL (ref 0.61–1.24)
GFR, Estimated: >60 mL/min (ref >60)
Glucose, Bld: 93 mg/dL (ref 70–99)
Potassium: 4.3 mmol/L (ref 3.5–5.1)
Sodium: 139 mmol/L (ref 135–145)

## 2020-07-14 NOTE — Progress Notes (Signed)
NO SOLID FOOD AFTER MIDNIGHT THE NIGHT PRIOR TO SURGERY. NOTHING BY MOUTH EXCEPT CLEAR LIQUIDS UNTIL   1115am  . PLEASE FINISH ENSURE DRINK PER SURGEON ORDER  WHICH NEEDS TO BE COMPLETED AT .1115am

## 2020-07-14 NOTE — Progress Notes (Signed)
DUE TO COVID-19 ONLY ONE VISITOR IS ALLOWED TO COME WITH YOU AND STAY IN THE WAITING ROOM ONLY DURING PRE OP AND PROCEDURE DAY OF SURGERY. THE 1 VISITOR  MAY VISIT WITH YOU AFTER SURGERY IN YOUR PRIVATE ROOM DURING VISITING HOURS ONLY!  YOU NEED TO HAVE A COVID 19 TEST ON__12/10/2019 _____ @_______ , THIS TEST MUST BE DONE BEFORE SURGERY,  COVID TESTING SITE [address] [place] , IT IS ON THE RIGHT GOING OUT [address] APPROXIMATELY  2 MINUTES PAST [employer] ON THE RIGHT. ONCE YOUR COVID TEST IS COMPLETED,  PLEASE BEGIN THE QUARANTINE INSTRUCTIONS AS OUTLINED IN YOUR HANDOUT.                Bobby James  07/14/2020   Your procedure is scheduled on: 12 02/2020   Report to [facility] Main  Entrance   Report to admitting at    1145 AM     Call this number if you have problems the morning of surgery [phone]    Remember: Do not eat food , candy gum or mints :After Midnight. You may have clear liquids from midnight until 1115am     CLEAR LIQUID DIET   Foods Allowed                                                                       Coffee and tea, regular and decaf                              Plain Jell-O any favor except red or purple                                            Fruit ices (not with fruit pulp)                                      Iced Popsicles                                     Carbonated beverages, regular and diet                                    Cranberry, grape and apple juices Sports drinks like Gatorade Lightly seasoned clear broth or consume(fat free) Sugar, honey syrup   _____________________________________________________________________    BRUSH YOUR TEETH MORNING OF SURGERY AND RINSE YOUR MOUTH OUT, NO CHEWING GUM CANDY OR MINTS.     Take these medicines the morning of surgery with A SIP OF WATER: none   DO NOT TAKE ANY DIABETIC MEDICATIONS DAY OF YOUR SURGERY                                You may not have any metal on  your body including hair pins and              piercings  Do not wear jewelry, make-up, lotions, powders or perfumes, deodorant             Do not wear nail polish on your fingernails.  Do not shave  48 hours prior to surgery.              Men may shave face and neck.   Do not bring valuables to the hospital. [place].  Contacts, dentures or bridgework may not be worn into surgery.  Leave suitcase in the car. After surgery it may be brought to your room.     Patients discharged the day of surgery will not be allowed to drive home. IF YOU ARE HAVING SURGERY AND GOING HOME THE SAME DAY, YOU MUST HAVE AN ADULT TO DRIVE YOU HOME AND BE WITH YOU FOR 24 HOURS. YOU MAY GO HOME BY TAXI OR UBER OR ORTHERWISE, BUT AN ADULT MUST ACCOMPANY YOU HOME AND STAY WITH YOU FOR 24 HOURS.  Name and phone number of your driver:  Special Instructions: N/A              Please read over the following fact sheets you were given: _____________________________________________________________________  [facility] - Preparing for Surgery Before surgery, you can play an important role.  Because skin is not sterile, your skin needs to be as free of germs as possible.  You can reduce the number of germs on your skin by washing with CHG (chlorahexidine gluconate) soap before surgery.  CHG is an antiseptic cleaner which kills germs and bonds with the skin to continue killing germs even after washing. Please DO NOT use if you have an allergy to CHG or antibacterial soaps.  If your skin becomes reddened/irritated stop using the CHG and inform your nurse when you arrive at Short Stay. Do not shave (including legs and underarms) for at least 48 hours prior to the first CHG shower.  You may shave your face/neck. Please follow these instructions carefully:  1.  Shower with CHG Soap the night before surgery and the  morning of Surgery.  2.  If you  choose to wash your hair, wash your hair first as usual with your  normal  shampoo.  3.  After you shampoo, rinse your hair and body thoroughly to remove the  shampoo.                           4.  Use CHG as you would any other liquid soap.  You can apply chg directly  to the skin and wash                       Gently with a scrungie or clean washcloth.  5.  Apply the CHG Soap to your body ONLY FROM THE NECK DOWN.   Do not use on face/ open                           Wound or open sores. Avoid contact with eyes, ears mouth and genitals (private parts).                       Wash face,  Genitals (  private parts) with your normal soap.             6.  Wash thoroughly, paying special attention to the area where your surgery  will be performed.  7.  Thoroughly rinse your body with warm water from the neck down.  8.  DO NOT shower/wash with your normal soap after using and rinsing off  the CHG Soap.                9.  Pat yourself dry with a clean towel.            10.  Wear clean pajamas.            11.  Place clean sheets on your bed the night of your first shower and do not  sleep with pets. Day of Surgery : Do not apply any lotions/deodorants the morning of surgery.  Please wear clean clothes to the hospital/surgery center.  FAILURE TO FOLLOW THESE INSTRUCTIONS MAY RESULT IN THE CANCELLATION OF YOUR SURGERY PATIENT SIGNATURE_________________________________  NURSE SIGNATURE__________________________________  ________________________________________________________________________

## 2020-07-23 ENCOUNTER — Other Ambulatory Visit (HOSPITAL_COMMUNITY)
Admission: RE | Admit: 2020-07-23 | Discharge: 2020-07-23 | Disposition: A | Discharge status: Home / Self Care | Payer: 59 | Source: Ambulatory Visit | Attending: Orthopedic Surgery | Admitting: Orthopedic Surgery

## 2020-07-23 DIAGNOSIS — Z20822 Contact with and (suspected) exposure to covid-19: Secondary | ICD-10-CM | POA: Diagnosis not present

## 2020-07-23 DIAGNOSIS — Z01812 Encounter for preprocedural laboratory examination: Secondary | ICD-10-CM | POA: Insufficient documentation

## 2020-07-23 LAB — SARS CORONAVIRUS 2 (TAT 6-24 HRS): SARS Coronavirus 2: NEGATIVE

## 2020-07-26 NOTE — H&P (Signed)
KNEE ARTHROPLASTY ADMISSION H&P  Patient ID: Bobby James James MRN: [id number] DOB/AGE: [birth year] 54 y.o.  Chief Complaint: right knee pain.  Planned Procedure Date: 07/26/20  HPI: Bobby James is a 54 y.o. male who presents for evaluation of djd right knee. The patient has a history of pain and functional disability in the right knee due to arthritis and has failed non-surgical conservative treatments for greater than 12 weeks to include NSAID's and/or analgesics and corticosteriod injections.  Onset of symptoms was gradual, starting 2 years ago with gradually worsening course since that time. The patient noted no past surgery on the right knee.  Patient currently rates pain at 5 out of 10 with activity. Patient has worsening of pain with activity and weight bearing, pain that interferes with activities of daily living and crepitus.  Patient has evidence of joint space narrowing by imaging studies.  There is no active infection.  Past Medical History:  Diagnosis Date  . Arthritis    Past Surgical History:  Procedure Laterality Date  . INCISION AND DRAINAGE PERIRECTAL ABSCESS  05/22/2012   Procedure: IRRIGATION AND DEBRIDEMENT PERIRECTAL ABSCESS;  Surgeon: Mariella Saa, MD;  Location: [facility] ORS;  Service: General;  Laterality: N/A;  Excision Perineal Abscess  . LAPAROSCOPIC GASTRIC BANDING  2009  . LASIK     right eye  . right carpal tunnel release     . right heel spur surgery     . ROTATOR CUFF REPAIR     right shoulder  . TONSILLECTOMY     as child   No Known Allergies Prior to Admission medications   Medication Sig Start Date End Date Taking? Authorizing Provider  meloxicam (MOBIC) 15 MG tablet Take 15 mg by mouth daily. 06/29/20  Yes [provider]   Social History   Socioeconomic History  . Marital status: Single    Spouse name: Not on file  . Number of children: Not on file  . Years of education: Not on file  . Highest education level: Not on file   Occupational History  . Not on file  Tobacco Use  . Smoking status: Never Smoker  . Smokeless tobacco: Never Used  Vaping Use  . Vaping Use: Former  Substance and Sexual Activity  . Alcohol use: Never  . Drug use: No  . Sexual activity: Not on file  Other Topics Concern  . Not on file  Social History Narrative  . Not on file   Social Determinants of Health   Financial Resource Strain:   . Difficulty of Paying Living Expenses: Not on file  Food Insecurity:   . Worried About Programme researcher, broadcasting/film/video in the Last Year: Not on file  . Ran Out of Food in the Last Year: Not on file  Transportation Needs:   . Lack of Transportation (Medical): Not on file  . Lack of Transportation (Non-Medical): Not on file  Physical Activity:   . Days of Exercise per Week: Not on file  . Minutes of Exercise per Session: Not on file  Stress:   . Feeling of Stress : Not on file  Social Connections:   . Frequency of Communication with Friends and Family: Not on file  . Frequency of Social Gatherings with Friends and Family: Not on file  . Attends Religious Services: Not on file  . Active Member of Clubs or Organizations: Not on file  . Attends Banker Meetings: Not on file  . Marital Status: Not  on file   No family history on file.  ROS: Currently denies lightheadedness, dizziness, Fever, chills, CP, SOB.   No personal history of DVT, PE, MI, or CVA. No loose teeth or denture. All other systems have been reviewed and were otherwise currently negative with the exception of those mentioned in the HPI and as above.  Objective: Vitals: HT: 5, 11"; WT: 266.4 pounds; BMI: 37.1; TEMP: 97.65F; BP: 120/77; PULSE: 86 bpm; O2: 97% on room air.   Physical Exam: General: Alert, NAD.  HEENT: EOMI, Good Neck Extension Pulm: No increased work of breathing.  Clear B/L A/P w/o crackle or wheeze. CV: RRR, No m/g/r appreciated. GI: soft, NT, ND. Normal bowel sounds.  Neuro: Neuro without gross focal  deficit.  Sensation intact distally Skin: No lesions in the area of chief complaint MSK/Surgical Site: 0-120 degrees of range of motion in a smooth arc with mild patellar crepitus.  No medial or lateral joint line tenderness.  No significant effusion.  Stable to ligamentous testing.     Imaging Review MRI demonstrates moderate degenerative joint disease of the right knee.   Preoperative templating of the joint replacement has been completed, documented, and submitted to the Operating Room personnel in order to optimize intra-operative equipment management.  Assessment: djd right knee Principal Problem:   Osteoarthritis of right knee   Plan: Plan for Procedure(s): TOTAL KNEE ARTHROPLASTY  The patient history, physical exam, clinical judgement of the provider and imaging are consistent with end stage degenerative joint disease and joint arthroplasty is deemed medically necessary. The treatment options including medical management, injection therapy, and arthroplasty were discussed at length. The risks and benefits of Procedure(s): TOTAL KNEE ARTHROPLASTY were presented and reviewed.  The risks of nonoperative treatment, versus surgical intervention including but not limited to continued pain, aseptic loosening, stiffness, dislocation/subluxation, infection, bleeding, nerve injury, blood clots, cardiopulmonary complications, morbidity, mortality, among others were discussed. The patient verbalizes understanding and wishes to proceed with the plan.  Patient is being admitted for inpatient treatment for surgery, pain control, PT, prophylactic antibiotics, VTE prophylaxis, progressive ambulation, ADL's and discharge planning.   Dental prophylaxis discussed and recommended for 2 years postoperatively.   The patient does meet the criteria for TXA which will be used perioperatively.    ASA 325 mg  will be used postoperatively for DVT prophylaxis in addition to SCDs, and early  ambulation.   Patient's anticipated LOS is less than 2 midnights, meeting these requirements: - Younger than 63 - Lives within 1 hour of care - Has a competent adult at home to recover with post-op recover - NO history of  - Chronic pain requiring opiods  - Diabetes  - Coronary Artery Disease  - Heart failure  - Heart attack  - Stroke  - DVT/VTE  - Cardiac arrhythmia  - Respiratory Failure/COPD  - Renal failure  - Anemia  - Advanced Liver disease        Armida Sans, PA-C 07/26/2020 8:47 AM

## 2020-07-27 ENCOUNTER — Encounter (HOSPITAL_COMMUNITY): Payer: Self-pay | Admitting: Orthopedic Surgery

## 2020-07-27 ENCOUNTER — Observation Stay (HOSPITAL_COMMUNITY)
Admission: RE | Admit: 2020-07-27 | Discharge: 2020-07-28 | Disposition: A | Discharge status: Home / Self Care | Payer: 59 | Source: Ambulatory Visit | Attending: Orthopedic Surgery | Admitting: Orthopedic Surgery

## 2020-07-27 ENCOUNTER — Other Ambulatory Visit: Payer: Self-pay

## 2020-07-27 ENCOUNTER — Encounter (HOSPITAL_COMMUNITY)
Admission: RE | Disposition: A | Discharge status: Home / Self Care | Payer: Self-pay | Source: Ambulatory Visit | Attending: Orthopedic Surgery

## 2020-07-27 ENCOUNTER — Observation Stay (HOSPITAL_COMMUNITY): Payer: 59

## 2020-07-27 ENCOUNTER — Ambulatory Visit (HOSPITAL_COMMUNITY): Payer: 59 | Admitting: Anesthesiology

## 2020-07-27 DIAGNOSIS — M1711 Unilateral primary osteoarthritis, right knee: Principal | ICD-10-CM | POA: Diagnosis present

## 2020-07-27 DIAGNOSIS — Z96651 Presence of right artificial knee joint: Secondary | ICD-10-CM

## 2020-07-27 HISTORY — PX: TOTAL KNEE ARTHROPLASTY: SHX125

## 2020-07-27 SURGERY — ARTHROPLASTY, KNEE, TOTAL
Anesthesia: Monitor Anesthesia Care | Site: Knee | Laterality: Right

## 2020-07-27 MED ORDER — SODIUM CHLORIDE 0.9 % IR SOLN
Status: DC | PRN
  Administered 2020-07-27: 1000 mL

## 2020-07-27 MED ORDER — ONDANSETRON HCL 4 MG/2ML IJ SOLN: 4.0000 mg | Freq: Four times a day (QID) | INTRAMUSCULAR | Status: DC | PRN

## 2020-07-27 MED ORDER — METHOCARBAMOL 500 MG PO TABS
500.0000 mg | ORAL_TABLET | Freq: Four times a day (QID) | ORAL | Status: DC | PRN
  Administered 2020-07-27 – 2020-07-28 (×2): 500 mg via ORAL
  Filled 2020-07-27 (×2): qty 1

## 2020-07-27 MED ORDER — DEXAMETHASONE SODIUM PHOSPHATE 10 MG/ML IJ SOLN
10.0000 mg | Freq: Once | INTRAMUSCULAR | Status: AC
  Administered 2020-07-28: 10 mg via INTRAVENOUS
  Filled 2020-07-27: qty 1

## 2020-07-27 MED ORDER — POVIDONE-IODINE 10 % EX SWAB
2.0000 "application " | Freq: Once | CUTANEOUS | Status: AC
  Administered 2020-07-27: 2 via TOPICAL

## 2020-07-27 MED ORDER — BUPIVACAINE IN DEXTROSE 0.75-8.25 % IT SOLN
INTRATHECAL | Status: DC | PRN
  Administered 2020-07-27: 1.8 mL via INTRATHECAL

## 2020-07-27 MED ORDER — ONDANSETRON HCL 4 MG/2ML IJ SOLN
INTRAMUSCULAR | Status: AC
  Filled 2020-07-27: qty 2

## 2020-07-27 MED ORDER — ONDANSETRON HCL 4 MG/2ML IJ SOLN
INTRAMUSCULAR | Status: DC | PRN
  Administered 2020-07-27: 4 mg via INTRAVENOUS

## 2020-07-27 MED ORDER — DIPHENHYDRAMINE HCL 12.5 MG/5ML PO ELIX: 12.5000 mg | ORAL_SOLUTION | ORAL | Status: DC | PRN

## 2020-07-27 MED ORDER — CEFAZOLIN SODIUM-DEXTROSE 1-4 GM/50ML-% IV SOLN
1.0000 g | Freq: Once | INTRAVENOUS | Status: DC
  Filled 2020-07-27: qty 50

## 2020-07-27 MED ORDER — METOCLOPRAMIDE HCL 5 MG/ML IJ SOLN: 5.0000 mg | Freq: Three times a day (TID) | INTRAMUSCULAR | Status: DC | PRN

## 2020-07-27 MED ORDER — OXYCODONE HCL 5 MG/5ML PO SOLN: 5.0000 mg | Freq: Once | ORAL | Status: DC | PRN

## 2020-07-27 MED ORDER — KETOROLAC TROMETHAMINE 30 MG/ML IJ SOLN
INTRAMUSCULAR | Status: AC
  Filled 2020-07-27: qty 1

## 2020-07-27 MED ORDER — FENTANYL CITRATE (PF) 100 MCG/2ML IJ SOLN
INTRAMUSCULAR | Status: DC | PRN
  Administered 2020-07-27: 25 ug via INTRAVENOUS

## 2020-07-27 MED ORDER — ASPIRIN EC 325 MG PO TBEC: 325.0000 mg | DELAYED_RELEASE_TABLET | Freq: Two times a day (BID) | ORAL | 0 refills | Status: DC

## 2020-07-27 MED ORDER — LIDOCAINE HCL (PF) 2 % IJ SOLN
INTRAMUSCULAR | Status: AC
  Filled 2020-07-27: qty 5

## 2020-07-27 MED ORDER — SENNA-DOCUSATE SODIUM 8.6-50 MG PO TABS: 2.0000 | ORAL_TABLET | Freq: Every day | ORAL | 1 refills | Status: DC

## 2020-07-27 MED ORDER — WATER FOR IRRIGATION, STERILE IR SOLN
Status: DC | PRN
  Administered 2020-07-27: 2000 mL

## 2020-07-27 MED ORDER — ACETAMINOPHEN 160 MG/5ML PO SOLN: 325.0000 mg | ORAL | Status: DC | PRN

## 2020-07-27 MED ORDER — ROPIVACAINE HCL 7.5 MG/ML IJ SOLN
INTRAMUSCULAR | Status: DC | PRN
  Administered 2020-07-27: 25 mL via PERINEURAL

## 2020-07-27 MED ORDER — MEPERIDINE HCL 50 MG/ML IJ SOLN: 6.2500 mg | INTRAMUSCULAR | Status: DC | PRN

## 2020-07-27 MED ORDER — CEFAZOLIN SODIUM-DEXTROSE 2-4 GM/100ML-% IV SOLN
2.0000 g | INTRAVENOUS | Status: AC
  Administered 2020-07-27: 2 g via INTRAVENOUS
  Administered 2020-07-27: 1 g via INTRAVENOUS
  Filled 2020-07-27: qty 100

## 2020-07-27 MED ORDER — OXYCODONE HCL 5 MG PO TABS
5.0000 mg | ORAL_TABLET | ORAL | Status: DC | PRN
  Administered 2020-07-27: 5 mg via ORAL
  Administered 2020-07-27: 10 mg via ORAL
  Filled 2020-07-27: qty 1

## 2020-07-27 MED ORDER — MIDAZOLAM HCL 5 MG/5ML IJ SOLN
INTRAMUSCULAR | Status: DC | PRN
  Administered 2020-07-27: 2 mg via INTRAVENOUS

## 2020-07-27 MED ORDER — ACETAMINOPHEN 325 MG PO TABS: 325.0000 mg | ORAL_TABLET | ORAL | Status: DC | PRN

## 2020-07-27 MED ORDER — TRANEXAMIC ACID-NACL 1000-0.7 MG/100ML-% IV SOLN
1000.0000 mg | Freq: Once | INTRAVENOUS | Status: AC
  Administered 2020-07-27: 1000 mg via INTRAVENOUS
  Filled 2020-07-27: qty 100

## 2020-07-27 MED ORDER — BUPIVACAINE HCL 0.25 % IJ SOLN
INTRAMUSCULAR | Status: DC | PRN
  Administered 2020-07-27: 30 mL

## 2020-07-27 MED ORDER — CHLORHEXIDINE GLUCONATE 0.12 % MT SOLN
15.0000 mL | Freq: Once | OROMUCOSAL | Status: AC
  Administered 2020-07-27: 15 mL via OROMUCOSAL

## 2020-07-27 MED ORDER — PROPOFOL 500 MG/50ML IV EMUL
INTRAVENOUS | Status: DC | PRN
  Administered 2020-07-27: 50 mg via INTRAVENOUS

## 2020-07-27 MED ORDER — DEXAMETHASONE SODIUM PHOSPHATE 10 MG/ML IJ SOLN
INTRAMUSCULAR | Status: AC
  Filled 2020-07-27: qty 1

## 2020-07-27 MED ORDER — BUPIVACAINE HCL (PF) 0.25 % IJ SOLN
INTRAMUSCULAR | Status: AC
  Filled 2020-07-27: qty 30

## 2020-07-27 MED ORDER — MAGNESIUM CITRATE PO SOLN: 1.0000 | Freq: Once | ORAL | Status: DC | PRN

## 2020-07-27 MED ORDER — MENTHOL 3 MG MT LOZG: 1.0000 | LOZENGE | OROMUCOSAL | Status: DC | PRN

## 2020-07-27 MED ORDER — METHOCARBAMOL 500 MG IVPB - SIMPLE MED
500.0000 mg | Freq: Four times a day (QID) | INTRAVENOUS | Status: DC | PRN
  Filled 2020-07-27: qty 50

## 2020-07-27 MED ORDER — DOCUSATE SODIUM 100 MG PO CAPS
100.0000 mg | ORAL_CAPSULE | Freq: Two times a day (BID) | ORAL | Status: DC
  Administered 2020-07-27 – 2020-07-28 (×2): 100 mg via ORAL
  Filled 2020-07-27 (×2): qty 1

## 2020-07-27 MED ORDER — HYDROMORPHONE HCL 1 MG/ML IJ SOLN: 0.5000 mg | INTRAMUSCULAR | Status: DC | PRN

## 2020-07-27 MED ORDER — CEFAZOLIN SODIUM-DEXTROSE 2-4 GM/100ML-% IV SOLN
2.0000 g | Freq: Four times a day (QID) | INTRAVENOUS | Status: AC
  Administered 2020-07-27 – 2020-07-28 (×2): 2 g via INTRAVENOUS
  Filled 2020-07-27 (×2): qty 100

## 2020-07-27 MED ORDER — MIDAZOLAM HCL 2 MG/2ML IJ SOLN
1.0000 mg | INTRAMUSCULAR | Status: DC
  Administered 2020-07-27: 2 mg via INTRAVENOUS
  Filled 2020-07-27: qty 2

## 2020-07-27 MED ORDER — LACTATED RINGERS IV SOLN: INTRAVENOUS | Status: DC

## 2020-07-27 MED ORDER — PROPOFOL 500 MG/50ML IV EMUL
INTRAVENOUS | Status: AC
  Filled 2020-07-27: qty 50

## 2020-07-27 MED ORDER — KETOROLAC TROMETHAMINE 30 MG/ML IJ SOLN
INTRAMUSCULAR | Status: DC | PRN
  Administered 2020-07-27: 30 mg

## 2020-07-27 MED ORDER — OXYCODONE HCL 5 MG PO TABS
10.0000 mg | ORAL_TABLET | ORAL | Status: DC | PRN
  Administered 2020-07-28 (×2): 10 mg via ORAL
  Filled 2020-07-27 (×3): qty 2

## 2020-07-27 MED ORDER — PHENOL 1.4 % MT LIQD: 1.0000 | OROMUCOSAL | Status: DC | PRN

## 2020-07-27 MED ORDER — ALUM & MAG HYDROXIDE-SIMETH 200-200-20 MG/5ML PO SUSP: 30.0000 mL | ORAL | Status: DC | PRN

## 2020-07-27 MED ORDER — FENTANYL CITRATE (PF) 100 MCG/2ML IJ SOLN
INTRAMUSCULAR | Status: AC
  Filled 2020-07-27: qty 2

## 2020-07-27 MED ORDER — TRANEXAMIC ACID-NACL 1000-0.7 MG/100ML-% IV SOLN
1000.0000 mg | INTRAVENOUS | Status: AC
  Administered 2020-07-27: 1000 mg via INTRAVENOUS
  Filled 2020-07-27: qty 100

## 2020-07-27 MED ORDER — OXYCODONE HCL 5 MG PO TABS: 5.0000 mg | ORAL_TABLET | Freq: Once | ORAL | Status: DC | PRN

## 2020-07-27 MED ORDER — ACETAMINOPHEN 500 MG PO TABS
1000.0000 mg | ORAL_TABLET | Freq: Once | ORAL | Status: AC
  Administered 2020-07-27: 1000 mg via ORAL
  Filled 2020-07-27: qty 2

## 2020-07-27 MED ORDER — ORAL CARE MOUTH RINSE: 15.0000 mL | Freq: Once | OROMUCOSAL | Status: AC

## 2020-07-27 MED ORDER — ONDANSETRON HCL 4 MG PO TABS: 4.0000 mg | ORAL_TABLET | Freq: Three times a day (TID) | ORAL | 0 refills | Status: DC | PRN

## 2020-07-27 MED ORDER — ONDANSETRON HCL 4 MG PO TABS: 4.0000 mg | ORAL_TABLET | Freq: Four times a day (QID) | ORAL | Status: DC | PRN

## 2020-07-27 MED ORDER — CLONIDINE HCL (ANALGESIA) 100 MCG/ML EP SOLN
EPIDURAL | Status: DC | PRN
  Administered 2020-07-27: 100 ug

## 2020-07-27 MED ORDER — FENTANYL CITRATE (PF) 100 MCG/2ML IJ SOLN: 25.0000 ug | INTRAMUSCULAR | Status: DC | PRN

## 2020-07-27 MED ORDER — ACETAMINOPHEN 325 MG PO TABS: 325.0000 mg | ORAL_TABLET | Freq: Four times a day (QID) | ORAL | Status: DC | PRN

## 2020-07-27 MED ORDER — DEXAMETHASONE SODIUM PHOSPHATE 10 MG/ML IJ SOLN
INTRAMUSCULAR | Status: DC | PRN
  Administered 2020-07-27: 10 mg via INTRAVENOUS

## 2020-07-27 MED ORDER — BISACODYL 10 MG RE SUPP: 10.0000 mg | Freq: Every day | RECTAL | Status: DC | PRN

## 2020-07-27 MED ORDER — 0.9 % SODIUM CHLORIDE (POUR BTL) OPTIME
TOPICAL | Status: DC | PRN
  Administered 2020-07-27: 1000 mL

## 2020-07-27 MED ORDER — ONDANSETRON HCL 4 MG/2ML IJ SOLN: 4.0000 mg | Freq: Once | INTRAMUSCULAR | Status: DC | PRN

## 2020-07-27 MED ORDER — METOCLOPRAMIDE HCL 5 MG PO TABS: 5.0000 mg | ORAL_TABLET | Freq: Three times a day (TID) | ORAL | Status: DC | PRN

## 2020-07-27 MED ORDER — FENTANYL CITRATE (PF) 100 MCG/2ML IJ SOLN
50.0000 ug | INTRAMUSCULAR | Status: DC
  Administered 2020-07-27: 100 ug via INTRAVENOUS
  Filled 2020-07-27: qty 2

## 2020-07-27 MED ORDER — PROPOFOL 10 MG/ML IV BOLUS
INTRAVENOUS | Status: AC
  Filled 2020-07-27: qty 40

## 2020-07-27 MED ORDER — ZOLPIDEM TARTRATE 5 MG PO TABS: 5.0000 mg | ORAL_TABLET | Freq: Every evening | ORAL | Status: DC | PRN

## 2020-07-27 MED ORDER — POTASSIUM CHLORIDE IN NACL 20-0.45 MEQ/L-% IV SOLN
INTRAVENOUS | Status: DC
  Filled 2020-07-27 (×2): qty 1000

## 2020-07-27 MED ORDER — ASPIRIN EC 325 MG PO TBEC
325.0000 mg | DELAYED_RELEASE_TABLET | Freq: Every day | ORAL | Status: DC
  Administered 2020-07-28: 325 mg via ORAL
  Filled 2020-07-27: qty 1

## 2020-07-27 MED ORDER — PROPOFOL 500 MG/50ML IV EMUL
INTRAVENOUS | Status: DC | PRN
  Administered 2020-07-27: 80 ug/kg/min via INTRAVENOUS

## 2020-07-27 MED ORDER — ACETAMINOPHEN 500 MG PO TABS
1000.0000 mg | ORAL_TABLET | Freq: Four times a day (QID) | ORAL | Status: AC
  Administered 2020-07-27 – 2020-07-28 (×4): 1000 mg via ORAL
  Filled 2020-07-27 (×4): qty 2

## 2020-07-27 MED ORDER — MIDAZOLAM HCL 2 MG/2ML IJ SOLN
INTRAMUSCULAR | Status: AC
  Filled 2020-07-27: qty 2

## 2020-07-27 MED ORDER — BACLOFEN 10 MG PO TABS: 10.0000 mg | ORAL_TABLET | Freq: Three times a day (TID) | ORAL | 0 refills | Status: DC

## 2020-07-27 MED ORDER — LIDOCAINE HCL (CARDIAC) PF 100 MG/5ML IV SOSY
PREFILLED_SYRINGE | INTRAVENOUS | Status: DC | PRN
  Administered 2020-07-27: 50 mg via INTRAVENOUS
  Administered 2020-07-27: 40 mg via INTRAVENOUS

## 2020-07-27 MED ORDER — OXYCODONE HCL 5 MG PO TABS
5.0000 mg | ORAL_TABLET | ORAL | 0 refills | Status: DC | PRN
Start: 2020-07-27 — End: 2023-03-01

## 2020-07-27 MED ORDER — POLYETHYLENE GLYCOL 3350 17 G PO PACK: 17.0000 g | PACK | Freq: Every day | ORAL | Status: DC | PRN

## 2020-07-27 SURGICAL SUPPLY — 58 items
ATTUNE MED DOME PAT 41 KNEE (Knees) ×2 IMPLANT
ATTUNE MED DOME PAT 41MM KNEE (Knees) ×1 IMPLANT
ATTUNE PS FEM RT SZ 8 CEM KNEE (Femur) ×3 IMPLANT
BAG ZIPLOCK 12X15 (MISCELLANEOUS) IMPLANT
BASEPLATE TIB CMT FB PCKT SZ6 (Knees) ×3 IMPLANT
BLADE SAG 18X100X1.27 (BLADE) ×6 IMPLANT
BLADE SURG 15 STRL LF DISP TIS (BLADE) ×1 IMPLANT
BLADE SURG 15 STRL SS (BLADE) ×3
BNDG ELASTIC 6X10 VLCR STRL LF (GAUZE/BANDAGES/DRESSINGS) ×3 IMPLANT
BOWL SMART MIX CTS (DISPOSABLE) ×3 IMPLANT
CEMENT BONE R 1X40 (Cement) ×6 IMPLANT
CLOSURE STERI-STRIP 1/2X4 (GAUZE/BANDAGES/DRESSINGS) ×2
CLOSURE WOUND 1/2 X4 (GAUZE/BANDAGES/DRESSINGS) ×1
CLSR STERI-STRIP ANTIMIC 1/2X4 (GAUZE/BANDAGES/DRESSINGS) ×4 IMPLANT
COVER SURGICAL LIGHT HANDLE (MISCELLANEOUS) ×3 IMPLANT
COVER WAND RF STERILE (DRAPES) IMPLANT
CUFF TOURN SGL QUICK 34 (TOURNIQUET CUFF) ×3
CUFF TRNQT CYL 34X4.125X (TOURNIQUET CUFF) ×1 IMPLANT
DECANTER SPIKE VIAL GLASS SM (MISCELLANEOUS) IMPLANT
DRAPE ORTHO SPLIT 77X108 STRL (DRAPES)
DRAPE SURG ORHT 6 SPLT 77X108 (DRAPES) IMPLANT
DRAPE U-SHAPE 47X51 STRL (DRAPES) ×3 IMPLANT
DRSG MEPILEX BORDER 4X12 (GAUZE/BANDAGES/DRESSINGS) ×3 IMPLANT
DRSG MEPILEX BORDER 4X8 (GAUZE/BANDAGES/DRESSINGS) ×3 IMPLANT
DRSG PAD ABDOMINAL 8X10 ST (GAUZE/BANDAGES/DRESSINGS) ×6 IMPLANT
DURAPREP 26ML APPLICATOR (WOUND CARE) ×6 IMPLANT
ELECT REM PT RETURN 15FT ADLT (MISCELLANEOUS) ×3 IMPLANT
GLOVE BIO SURGEON STRL SZ7 (GLOVE) ×3 IMPLANT
GLOVE BIO SURGEON STRL SZ7.5 (GLOVE) ×3 IMPLANT
GLOVE BIOGEL PI IND STRL 7.0 (GLOVE) ×1 IMPLANT
GLOVE BIOGEL PI IND STRL 8 (GLOVE) ×1 IMPLANT
GLOVE BIOGEL PI INDICATOR 7.0 (GLOVE) ×2
GLOVE BIOGEL PI INDICATOR 8 (GLOVE) ×2
GOWN STRL REUS W/TWL LRG LVL3 (GOWN DISPOSABLE) ×6 IMPLANT
HANDPIECE INTERPULSE COAX TIP (DISPOSABLE) ×3
HOLDER FOLEY CATH W/STRAP (MISCELLANEOUS) IMPLANT
HOOD PEEL AWAY FLYTE STAYCOOL (MISCELLANEOUS) ×9 IMPLANT
IMMOBILIZER KNEE 20 (SOFTGOODS) ×3
IMMOBILIZER KNEE 20 THIGH 36 (SOFTGOODS) ×1 IMPLANT
INSERT TIBIA FIXED BEARING SZ8 (Insert) ×3 IMPLANT
KIT TURNOVER KIT A (KITS) IMPLANT
MANIFOLD NEPTUNE II (INSTRUMENTS) ×3 IMPLANT
NS IRRIG 1000ML POUR BTL (IV SOLUTION) ×3 IMPLANT
PACK ICE MAXI GEL EZY WRAP (MISCELLANEOUS) ×3 IMPLANT
PACK TOTAL KNEE CUSTOM (KITS) ×3 IMPLANT
PENCIL SMOKE EVACUATOR (MISCELLANEOUS) IMPLANT
PIN DRILL FIX HALF THREAD (BIT) ×3 IMPLANT
PIN STEINMAN FIXATION KNEE (PIN) ×3 IMPLANT
PROTECTOR NERVE ULNAR (MISCELLANEOUS) ×3 IMPLANT
SET HNDPC FAN SPRY TIP SCT (DISPOSABLE) ×1 IMPLANT
STRIP CLOSURE SKIN 1/2X4 (GAUZE/BANDAGES/DRESSINGS) ×2 IMPLANT
SUT VIC AB 1 CT1 36 (SUTURE) ×6 IMPLANT
SUT VIC AB 2-0 CT1 27 (SUTURE) ×3
SUT VIC AB 2-0 CT1 TAPERPNT 27 (SUTURE) ×1 IMPLANT
SUT VIC AB 3-0 SH 8-18 (SUTURE) ×3 IMPLANT
TRAY FOLEY MTR SLVR 16FR STAT (SET/KITS/TRAYS/PACK) ×3 IMPLANT
WATER STERILE IRR 1000ML POUR (IV SOLUTION) ×6 IMPLANT
WRAP KNEE MAXI GEL POST OP (GAUZE/BANDAGES/DRESSINGS) ×3 IMPLANT

## 2020-07-27 NOTE — Anesthesia Procedure Notes (Signed)
Procedure Name: MAC Date/Time: 07/27/2020 2:11 PM Performed by: Michele Rockers, CRNA Pre-anesthesia Checklist: Patient identified, Emergency Drugs available, Suction available, Timeout performed and Patient being monitored Patient Re-evaluated:Patient Re-evaluated prior to induction Oxygen Delivery Method: Simple face mask

## 2020-07-27 NOTE — Plan of Care (Signed)
Problem: Education: Goal: Knowledge of General Education information will improve Description: Including pain rating scale, medication(s)/side effects and non-pharmacologic comfort measures 07/27/2020 1947 by Minette Brine, RN Outcome: Progressing 07/27/2020 1947 by Minette Brine, RN Outcome: Adequate for Discharge   Problem: Health Behavior/Discharge Planning: Goal: Ability to manage health-related needs will improve 07/27/2020 1947 by Minette Brine, RN Outcome: Progressing 07/27/2020 1947 by Minette Brine, RN Outcome: Adequate for Discharge   Problem: Clinical Measurements: Goal: Ability to maintain clinical measurements within normal limits will improve 07/27/2020 1947 by Minette Brine, RN Outcome: Progressing 07/27/2020 1947 by Minette Brine, RN Outcome: Adequate for Discharge Goal: Will remain free from infection 07/27/2020 1947 by Minette Brine, RN Outcome: Progressing 07/27/2020 1947 by Minette Brine, RN Outcome: Adequate for Discharge Goal: Diagnostic test results will improve 07/27/2020 1947 by Minette Brine, RN Outcome: Progressing 07/27/2020 1947 by Minette Brine, RN Outcome: Adequate for Discharge Goal: Respiratory complications will improve 07/27/2020 1947 by Minette Brine, RN Outcome: Progressing 07/27/2020 1947 by Minette Brine, RN Outcome: Adequate for Discharge Goal: Cardiovascular complication will be avoided 07/27/2020 1947 by Minette Brine, RN Outcome: Progressing 07/27/2020 1947 by Minette Brine, RN Outcome: Adequate for Discharge   Problem: Activity: Goal: Risk for activity intolerance will decrease 07/27/2020 1947 by Minette Brine, RN Outcome: Progressing 07/27/2020 1947 by Minette Brine, RN Outcome: Adequate for Discharge   Problem: Nutrition: Goal: Adequate nutrition will be maintained 07/27/2020 1947 by Minette Brine, RN Outcome: Progressing 07/27/2020 1947 by Minette Brine,  RN Outcome: Adequate for Discharge   Problem: Coping: Goal: Level of anxiety will decrease 07/27/2020 1947 by Minette Brine, RN Outcome: Progressing 07/27/2020 1947 by Minette Brine, RN Outcome: Adequate for Discharge   Problem: Elimination: Goal: Will not experience complications related to bowel motility 07/27/2020 1947 by Minette Brine, RN Outcome: Progressing 07/27/2020 1947 by Minette Brine, RN Outcome: Adequate for Discharge Goal: Will not experience complications related to urinary retention 07/27/2020 1947 by Minette Brine, RN Outcome: Progressing 07/27/2020 1947 by Minette Brine, RN Outcome: Adequate for Discharge   Problem: Pain Managment: Goal: General experience of comfort will improve 07/27/2020 1947 by Minette Brine, RN Outcome: Progressing 07/27/2020 1947 by Minette Brine, RN Outcome: Adequate for Discharge   Problem: Safety: Goal: Ability to remain free from injury will improve 07/27/2020 1947 by Minette Brine, RN Outcome: Progressing 07/27/2020 1947 by Minette Brine, RN Outcome: Adequate for Discharge   Problem: Education: Goal: Knowledge of the prescribed therapeutic regimen will improve 07/27/2020 1947 by Minette Brine, RN Outcome: Progressing 07/27/2020 1947 by Minette Brine, RN Outcome: Adequate for Discharge Goal: Individualized Educational Video(s) 07/27/2020 1947 by Minette Brine, RN Outcome: Progressing 07/27/2020 1947 by Minette Brine, RN Outcome: Adequate for Discharge   Problem: Skin Integrity: Goal: Risk for impaired skin integrity will decrease 07/27/2020 1947 by Minette Brine, RN Outcome: Progressing 07/27/2020 1947 by Minette Brine, RN Outcome: Adequate for Discharge   Problem: Clinical Measurements: Goal: Postoperative complications will be avoided or minimized 07/27/2020 1947 by Minette Brine, RN Outcome: Progressing 07/27/2020 1947 by Minette Brine, RN Outcome: Adequate  for Discharge   Problem: Pain Management: Goal: Pain level will decrease with appropriate interventions 07/27/2020 1947 by Minette Brine, RN Outcome: Progressing 07/27/2020 1947 by Minette Brine, RN Outcome: Adequate for Discharge   Problem: Skin Integrity: Goal: Will show signs of  wound healing 07/27/2020 1947 by Minette Brine, RN Outcome: Progressing 07/27/2020 1947 by Minette Brine, RN Outcome: Adequate for Discharge

## 2020-07-27 NOTE — Transfer of Care (Signed)
Immediate Anesthesia Transfer of Care Note  Patient: Bobby James  Procedure(s) Performed: TOTAL KNEE ARTHROPLASTY (Right Knee)  Patient Location: PACU  Anesthesia Type:Spinal and MAC combined with regional for post-op pain  Level of Consciousness: awake, alert , oriented and patient cooperative  Airway & Oxygen Therapy: Patient Spontanous Breathing and Patient connected to face mask oxygen  Post-op Assessment: Report given to RN and Post -op Vital signs reviewed and stable  Post vital signs: Reviewed and stable  Last Vitals:  Vitals Value Taken Time  BP 122/81 07/27/20 1704  Temp    Pulse 67 07/27/20 1705  Resp 16 07/27/20 1705  SpO2 99 % 07/27/20 1705  Vitals shown include unvalidated device data.  Last Pain:  Vitals:   07/27/20 1203  TempSrc: Oral  PainSc:          Complications: No complications documented.

## 2020-07-27 NOTE — Discharge Instructions (Signed)
INSTRUCTIONS AFTER JOINT REPLACEMENT  ° °o Remove items at home which could result in a fall. This includes throw rugs or furniture in walking pathways °o ICE to the affected joint every three hours while awake for 30 minutes at a time, for at least the first 3-5 days, and then as needed for pain and swelling.  Continue to use ice for pain and swelling. You may notice swelling that will progress down to the foot and ankle.  This is normal after surgery.  Elevate your leg when you are not up walking on it.   °o Continue to use the breathing machine you got in the hospital (incentive spirometer) which will help keep your temperature down.  It is common for your temperature to cycle up and down following surgery, especially at night when you are not up moving around and exerting yourself.  The breathing machine keeps your lungs expanded and your temperature down. ° ° °DIET:  As you were doing prior to hospitalization, we recommend a well-balanced diet. ° °DRESSING / WOUND CARE / SHOWERING ° °You may change your dressing 3-5 days after surgery.  Then change the dressing every day with sterile gauze.  Please use good hand washing techniques before changing the dressing.  Do not use any lotions or creams on the incision until instructed by your surgeon. ° °ACTIVITY ° °o Increase activity slowly as tolerated, but follow the weight bearing instructions below.   °o No driving for 6 weeks or until further direction given by your physician.  You cannot drive while taking narcotics.  °o No lifting or carrying greater than 10 lbs. until further directed by your surgeon. °o Avoid periods of inactivity such as sitting longer than an hour when not asleep. This helps prevent blood clots.  °o You may return to work once you are authorized by your doctor.  ° ° ° °WEIGHT BEARING  ° °Weight bearing as tolerated with assist device (walker, cane, etc) as directed, use it as long as suggested by your surgeon or therapist, typically at  least 4-6 weeks. ° ° °EXERCISES ° °Results after joint replacement surgery are often greatly improved when you follow the exercise, range of motion and muscle strengthening exercises prescribed by your doctor. Safety measures are also important to protect the joint from further injury. Any time any of these exercises cause you to have increased pain or swelling, decrease what you are doing until you are comfortable again and then slowly increase them. If you have problems or questions, call your caregiver or physical therapist for advice.  ° °Rehabilitation is important following a joint replacement. After just a few days of immobilization, the muscles of the leg can become weakened and shrink (atrophy).  These exercises are designed to build up the tone and strength of the thigh and leg muscles and to improve motion. Often times heat used for twenty to thirty minutes before working out will loosen up your tissues and help with improving the range of motion but do not use heat for the first two weeks following surgery (sometimes heat can increase post-operative swelling).  ° °These exercises can be done on a training (exercise) mat, on the floor, on a table or on a bed. Use whatever works the best and is most comfortable for you.    Use music or television while you are exercising so that the exercises are a pleasant break in your day. This will make your life better with the exercises acting as a break   in your routine that you can look forward to.   Perform all exercises about fifteen times, three times per day or as directed.  You should exercise both the operative leg and the other leg as well. ° °Exercises include: °  °• Quad Sets - Tighten up the muscle on the front of the thigh (Quad) and hold for 5-10 seconds.   °• Straight Leg Raises - With your knee straight (if you were given a brace, keep it on), lift the leg to 60 degrees, hold for 3 seconds, and slowly lower the leg.  Perform this exercise against  resistance later as your leg gets stronger.  °• Leg Slides: Lying on your back, slowly slide your foot toward your buttocks, bending your knee up off the floor (only go as far as is comfortable). Then slowly slide your foot back down until your leg is flat on the floor again.  °• Angel Wings: Lying on your back spread your legs to the side as far apart as you can without causing discomfort.  °• Hamstring Strength:  Lying on your back, push your heel against the floor with your leg straight by tightening up the muscles of your buttocks.  Repeat, but this time bend your knee to a comfortable angle, and push your heel against the floor.  You may put a pillow under the heel to make it more comfortable if necessary.  ° °A rehabilitation program following joint replacement surgery can speed recovery and prevent re-injury in the future due to weakened muscles. Contact your doctor or a physical therapist for more information on knee rehabilitation.  ° ° °CONSTIPATION ° °Constipation is defined medically as fewer than three stools per week and severe constipation as less than one stool per week.  Even if you have a regular bowel pattern at home, your normal regimen is likely to be disrupted due to multiple reasons following surgery.  Combination of anesthesia, postoperative narcotics, change in appetite and fluid intake all can affect your bowels.  ° °YOU MUST use at least one of the following options; they are listed in order of increasing strength to get the job done.  They are all available over the counter, and you may need to use some, POSSIBLY even all of these options:   ° °Drink plenty of fluids (prune juice may be helpful) and high fiber foods °Colace 100 mg by mouth twice a day  °Senokot for constipation as directed and as needed Dulcolax (bisacodyl), take with full glass of water  °Miralax (polyethylene glycol) once or twice a day as needed. ° °If you have tried all these things and are unable to have a bowel  movement in the first 3-4 days after surgery call either your surgeon or your primary doctor.   ° °If you experience loose stools or diarrhea, hold the medications until you stool forms back up.  If your symptoms do not get better within 1 week or if they get worse, check with your doctor.  If you experience "the worst abdominal pain ever" or develop nausea or vomiting, please contact the office immediately for further recommendations for treatment. ° ° °ITCHING:  If you experience itching with your medications, try taking only a single pain pill, or even half a pain pill at a time.  You can also use Benadryl over the counter for itching or also to help with sleep.  ° °TED HOSE STOCKINGS:  Use stockings on both legs until for at least 2 weeks or as   directed by physician office. They may be removed at night for sleeping. ° °MEDICATIONS:  See your medication summary on the “After Visit Summary” that nursing will review with you.  You may have some home medications which will be placed on hold until you complete the course of blood thinner medication.  It is important for you to complete the blood thinner medication as prescribed. ° °PRECAUTIONS:  If you experience chest pain or shortness of breath - call 911 immediately for transfer to the hospital emergency department.  ° °If you develop a fever greater that 101 F, purulent drainage from wound, increased redness or drainage from wound, foul odor from the wound/dressing, or calf pain - CONTACT YOUR SURGEON.   °                                                °FOLLOW-UP APPOINTMENTS:  If you do not already have a post-op appointment, please call the office for an appointment to be seen by your surgeon.  Guidelines for how soon to be seen are listed in your “After Visit Summary”, but are typically between 1-4 weeks after surgery. ° °OTHER INSTRUCTIONS:  ° °Knee Replacement:  Do not place pillow under knee, focus on keeping the knee straight while resting.  ° °DENTAL  ANTIBIOTICS: ° °In most cases prophylactic antibiotics for Dental procdeures after total joint surgery are not necessary. ° °Exceptions are as follows: ° °1. History of prior total joint infection ° °2. Severely immunocompromised (Organ Transplant, cancer chemotherapy, Rheumatoid biologic °meds such as Humera) ° °3. Poorly controlled diabetes (A1C &gt; 8.0, blood glucose over 200) ° °If you have one of these conditions, contact your surgeon for an antibiotic prescription, prior to your °dental procedure. ° ° °MAKE SURE YOU:  °• Understand these instructions.  °• Get help right away if you are not doing well or get worse.  ° ° °Thank you for letting us be a part of your medical care team.  It is a privilege we respect greatly.  We hope these instructions will help you stay on track for a fast and full recovery!  ° °

## 2020-07-27 NOTE — Anesthesia Procedure Notes (Signed)
Spinal  Patient location during procedure: OR Start time: 07/27/2020 2:16 PM End time: 07/27/2020 2:20 PM Staffing Anesthesiologist: Bethena Midget, MD Preanesthetic Checklist Completed: patient identified, IV checked, site marked, risks and benefits discussed, surgical consent, monitors and equipment checked, pre-op evaluation and timeout performed Spinal Block Patient position: sitting Prep: DuraPrep Patient monitoring: heart rate, cardiac monitor, continuous pulse ox and blood pressure Approach: midline Location: L3-4 Injection technique: single-shot Needle Needle type: Sprotte  Needle gauge: 24 G Needle length: 9 cm Assessment Sensory level: T4

## 2020-07-27 NOTE — Anesthesia Preprocedure Evaluation (Addendum)
Anesthesia Evaluation  Patient identified by MRN, date of birth, ID band Patient awake    Reviewed: Allergy & Precautions, H&P , NPO status , Patient's Chart, lab work & pertinent test results  Airway Mallampati: III  TM Distance: <3 FB Neck ROM: Full    Dental no notable dental hx. (+) Teeth Intact, Dental Advisory Given   Pulmonary neg pulmonary ROS,    Pulmonary exam normal breath sounds clear to auscultation (-) decreased breath sounds      Cardiovascular negative cardio ROS Normal cardiovascular exam Rhythm:Regular Rate:Normal     Neuro/Psych negative neurological ROS  negative psych ROS   GI/Hepatic negative GI ROS, Neg liver ROS,   Endo/Other  Morbid obesity  Renal/GU negative Renal ROS  negative genitourinary   Musculoskeletal  (+) Arthritis , Osteoarthritis,    Abdominal   Peds negative pediatric ROS (+)  Hematology negative hematology ROS (+)   Anesthesia Other Findings   Reproductive/Obstetrics negative OB ROS                            Anesthesia Physical  Anesthesia Plan  ASA: III  Anesthesia Plan: MAC and Spinal   Post-op Pain Management:  Regional for Post-op pain   Induction: Intravenous  PONV Risk Score and Plan: 2 and Treatment may vary due to age or medical condition and Propofol infusion  Airway Management Planned: LMA and Oral ETT  Additional Equipment:   Intra-op Plan:   Post-operative Plan:   Informed Consent: I have reviewed the patients History and Physical, chart, labs and discussed the procedure including the risks, benefits and alternatives for the proposed anesthesia with the patient or authorized representative who has indicated his/her understanding and acceptance.     Dental advisory given  Plan Discussed with: CRNA, Surgeon and Anesthesiologist  Anesthesia Plan Comments:        Anesthesia Quick Evaluation

## 2020-07-27 NOTE — Progress Notes (Signed)
AssistedDr. Oddono with right, ultrasound guided, adductor canal block. Side rails up, monitors on throughout procedure. See vital signs in flow sheet. Tolerated Procedure well.  

## 2020-07-27 NOTE — Interval H&P Note (Signed)
History and Physical Interval Note:  07/27/2020 12:57 PM  Bobby James  has presented today for surgery, with the diagnosis of djd right knee.  The various methods of treatment have been discussed with the patient and family. After consideration of risks, benefits and other options for treatment, the patient has consented to  Procedure(s): TOTAL KNEE ARTHROPLASTY (Right) as a surgical intervention.  The patient's history has been reviewed, patient examined, no change in status, stable for surgery.  I have reviewed the patient's chart and labs.  Questions were answered to the patient's satisfaction.     Eulas Post

## 2020-07-27 NOTE — Op Note (Signed)
DATE OF SURGERY:  07/27/2020 TIME: 4:22 PM  PATIENT NAME:  Bobby James   AGE: 54 y.o.    PRE-OPERATIVE DIAGNOSIS: Right knee primary localized osteoarthritis  POST-OPERATIVE DIAGNOSIS:  Same  PROCEDURE: RIGHT total Knee Arthroplasty  SURGEON:  Eulas Post, MD   ASSISTANT:  Janine Ores, PA-C, present and scrubbed throughout the case, critical for assistance with exposure, retraction, instrumentation, and closure.   OPERATIVE IMPLANTS: DePuy attune size 8 femur, with a 5 mm polyethylene insert, with a size 6 tibia and a 41 mm patellar dome, medialized   PREOPERATIVE INDICATIONS:  VELMER WOELFEL James is a 54 y.o. year old male with end stage bone on bone degenerative arthritis of the knee who failed conservative treatment, including injections, antiinflammatories, activity modification, and assistive devices, and had significant impairment of their activities of daily living, and elected for Total Knee Arthroplasty.   The risks, benefits, and alternatives were discussed at length including but not limited to the risks of infection, bleeding, nerve injury, stiffness, blood clots, the need for revision surgery, cardiopulmonary complications, among others, and they were willing to proceed.  OPERATIVE FINDINGS AND UNIQUE ASPECTS OF THE CASE: He did in fact have significant extensive erosive arthritic changes in all compartments, interestingly the periphery of the cartilage was still somewhat intact but centrally he had normal bone throughout, I suspect this is why his x-rays did not look as bad as the MRI predicted.  I did end up having to cut the tibia twice, and also the femur twice, and he still may have had some degree of tightness in flexion, with the trial implant it was challenging to get the trial in, although the femur also did not want to stay seated on the bone, but ultimately with the real implants it sat down nicely. I cut the femur at 3 degrees of external rotation,  and the patella tracked reasonably well, although have it did have a slight tendency to laterally subluxate, but with the capsule closed it tracked nicely.  ESTIMATED BLOOD LOSS: 100 mL  OPERATIVE DESCRIPTION:  The patient was brought to the operative room and placed in a supine position. Spinal anesthesia was administered.  IV antibiotics were given.  The lower extremity was prepped and draped in the usual sterile fashion.  Time out was performed.  The leg was elevated and exsanguinated and the tourniquet was inflated.  Anterior quadriceps tendon splitting approach was performed.  The patella was everted and osteophytes were removed.  The anterior horn of the medial and lateral meniscus was removed.   The patella was everted, and I cut the patella and it measured from 24 mm down to 15 mm.  The distal femur was opened with the drill and the intramedullary distal femoral cutting jig was utilized, set at 5 degrees resecting 9 mm off the distal femur.  Care was taken to protect the collateral ligaments.  Then the extramedullary tibial cutting jig was utilized making the appropriate cut using the anterior tibial crest as a reference building in appropriate posterior slope.  Care was taken during the cut to protect the medial and collateral ligaments.  The proximal tibia was removed along with the posterior horns of the menisci.  The PCL was sacrificed.    The extensor gap initially tight, and I ended up taking another 2 mm off of the tibia, and another 2 mm off of the femur, which ultimately allowed for placement of the 5 trial spacer block.  The distal  femoral sizing jig was applied, taking care to avoid notching.  Then the 4-in-1 cutting jig was applied and the anterior and posterior femur was cut, along with the chamfer cuts.  All posterior osteophytes were removed.  The flexion gap was then measured and was symmetric with the extension gap.  I completed the distal femoral preparation using the  appropriate jig to prepare the box.  The proximal tibia sized and prepared accordingly with the reamer and the punch, and then all components were trialed with the 5 poly insert.  The knee was found to have appropriate balance and full motion.    The above named components were then cemented into place and all excess cement was removed.  The real polyethylene implant was placed.  After the cement had cured I released the tourniquet and confirmed excellent hemostasis with no major posterior vessel injury.    The knee was easily taken through a range of motion and the patella tracked well and the knee irrigated copiously and the parapatellar and subcutaneous tissue closed with vicryl, and monocryl with steri strips for the skin.  The wounds were injected with marcaine, and dressed with sterile gauze and the patient was awakened and returned to the PACU in stable and satisfactory condition.  There were no complications.  Total tourniquet time was 90 minutes.

## 2020-07-27 NOTE — Anesthesia Procedure Notes (Addendum)
Anesthesia Regional Block: Adductor canal block   Pre-Anesthetic Checklist: ,, timeout performed, Correct Patient, Correct Site, Correct Laterality, Correct Procedure, Correct Position, site marked, Risks and benefits discussed,  Surgical consent,  Pre-op evaluation,  At surgeon's request and post-op pain management  Laterality: Right  Prep: chloraprep       Needles:  Injection technique: Single-shot  Needle Type: Echogenic Stimulator Needle     Needle Length: 5cm  Needle Gauge: 22     Additional Needles:   Procedures:, nerve stimulator,,, ultrasound used (permanent image in chart),,,,  Narrative:  Start time: 07/27/2020 1:00 PM End time: 07/27/2020 1:05 PM Injection made incrementally with aspirations every 5 mL.  Performed by: Personally  Anesthesiologist: Bethena Midget, MD  Additional Notes: Functioning IV was confirmed and monitors were applied.  A 91mm 22ga Arrow echogenic stimulator needle was used. Sterile prep and drape,hand hygiene and sterile gloves were used. Ultrasound guidance: relevant anatomy identified, needle position confirmed, local anesthetic spread visualized around nerve(s)., vascular puncture avoided.  Image printed for medical record. Negative aspiration and negative test dose prior to incremental administration of local anesthetic. The patient tolerated the procedure well.

## 2020-07-28 DIAGNOSIS — M1711 Unilateral primary osteoarthritis, right knee: Secondary | ICD-10-CM | POA: Diagnosis not present

## 2020-07-28 LAB — BASIC METABOLIC PANEL
Anion gap: 8 (ref 5–15)
BUN: 15 mg/dL (ref 6–20)
CO2: 23 mmol/L (ref 22–32)
Calcium: 8.9 mg/dL (ref 8.9–10.3)
Chloride: 105 mmol/L (ref 98–111)
Creatinine, Ser: 0.94 mg/dL (ref 0.61–1.24)
GFR, Estimated: >60 mL/min (ref >60)
Glucose, Bld: 147 mg/dL — ABNORMAL HIGH (ref 70–99)
Potassium: 4.3 mmol/L (ref 3.5–5.1)
Sodium: 136 mmol/L (ref 135–145)

## 2020-07-28 LAB — CBC
HCT: 42 % (ref 39.0–52.0)
Hemoglobin: 13.9 g/dL (ref 13.0–17.0)
MCH: 28 pg (ref 26.0–34.0)
MCHC: 33.1 g/dL (ref 30.0–36.0)
MCV: 84.7 fL (ref 80.0–100.0)
Platelets: 256 10*3/uL (ref 150–400)
RBC: 4.96 MIL/uL (ref 4.22–5.81)
RDW: 13.3 % (ref 11.5–15.5)
WBC: 17.2 10*3/uL — ABNORMAL HIGH (ref 4.0–10.5)
nRBC: 0 % (ref 0.0–0.2)

## 2020-07-28 NOTE — TOC Transition Note (Signed)
Transition of Care [facility]) - CM/SW Discharge Note   Patient Details  Name: Bobby James MRN: [id number] Date of Birth: 1966/01/07  [facility] CM/SW Contact:  Clearance Coots, LCSW Phone Number: 07/28/2020, 11:53 AM   Clinical Narrative:    Therapy Plan: Per patient, OPPT this Friday@ 10:00am Patient confirm he has DME-RW and 3 in1    Final next level of care: OP Rehab Barriers to Discharge: Barriers Resolved   Patient Goals and CMS Choice        Discharge Placement                       Discharge Plan and Services                                     Social Determinants of Health (SDOH) Interventions     Readmission Risk Interventions No flowsheet data found.

## 2020-07-28 NOTE — Evaluation (Signed)
Physical Therapy Evaluation Patient Details Name: Bobby James MRN: [id number] DOB: [birth year] Today's Date: 07/28/2020   History of Present Illness  Pt is a 54 year old male s/p R TKA  Clinical Impression  Pt is s/p TKA resulting in the deficits listed below (see PT Problem List).  Pt assisted with ambulating in hallway and safe stair technique POD#1.  Pt requested one more session prior to d/c home today.  Pt reports his brother will stay with him, and pt plans to remain on lower level of his split level home. Pt will benefit from skilled PT to increase their independence and safety with mobility to allow discharge to the venue listed below.      Follow Up Recommendations Follow surgeon's recommendation for DC plan and follow-up therapies    Equipment Recommendations  None recommended by PT    Recommendations for Other Services       Precautions / Restrictions Precautions Precautions: Knee;Fall Precaution Comments: able to perform SLR Restrictions Weight Bearing Restrictions: No      Mobility  Bed Mobility Overal bed mobility: Needs Assistance Bed Mobility: Supine to Sit     Supine to sit: Min guard;HOB elevated     General bed mobility comments: verbal cues for technique    Transfers Overall transfer level: Needs assistance Equipment used: Rolling walker (2 wheeled) Transfers: Sit to/from Stand Sit to Stand: Min guard         General transfer comment: verbal cues for UE and LE positioning  Ambulation/Gait Ambulation/Gait assistance: Min guard Gait Distance (Feet): 180 Feet Assistive device: Rolling walker (2 wheeled) Gait Pattern/deviations: Step-to pattern;Decreased stance time - right;Decreased weight shift to right;Antalgic     General Gait Details: verbal cues for sequence, RW positioning, posture, allowing knee flexion  Stairs Stairs: Yes Stairs assistance: Min guard Stair Management: Step to pattern;Forwards;Backwards;Two rails Number of  Stairs: 4 General stair comments: verbal cues for sequence and safety, pt performed both forwards and backwards; pt reports his brother can assist him with stairs  Wheelchair Mobility    Modified Rankin (Stroke Patients Only)       Balance                                             Pertinent Vitals/Pain Pain Assessment: 0-10 Pain Score: 5  Pain Location: right knee Pain Descriptors / Indicators: Sore;Aching Pain Intervention(s): Monitored during session;Repositioned;Premedicated before session    Home Living Family/patient expects to be discharged to:: Private residence Living Arrangements: Alone Available Help at Discharge: Family (brother to assist upon d/c)   Home Access: Stairs to enter Entrance Stairs-Rails: Right Entrance Stairs-Number of Steps: 3 Home Layout: Two level Home Equipment: Environmental consultant - 2 wheels;Bedside commode      Prior Function Level of Independence: Independent               Hand Dominance        Extremity/Trunk Assessment        Lower Extremity Assessment Lower Extremity Assessment: RLE deficits/detail RLE Deficits / Details: able to perform SLR and ankle pumps, knee flexion TBA       Communication   Communication: No difficulties  Cognition Arousal/Alertness: Awake/alert Behavior During Therapy: WFL for tasks assessed/performed Overall Cognitive Status: Within Functional Limits for tasks assessed  General Comments      Exercises     Assessment/Plan    PT Assessment Patient needs continued PT services  PT Problem List Pain;Decreased knowledge of use of DME;Decreased activity tolerance;Decreased range of motion;Decreased strength;Decreased mobility;Decreased knowledge of precautions       PT Treatment Interventions DME instruction;Gait training;Balance training;Therapeutic exercise;Stair training;Functional mobility training;Therapeutic  activities;Patient/family education    PT Goals (Current goals can be found in the Care Plan section)  Acute Rehab PT Goals PT Goal Formulation: With patient Time For Goal Achievement: 07/31/20 Potential to Achieve Goals: Good    Frequency 7X/week   Barriers to discharge        Co-evaluation               AM-PAC PT "6 Clicks" Mobility  Outcome Measure Help needed turning from your back to your side while in a flat bed without using bedrails?: A Little Help needed moving from lying on your back to sitting on the side of a flat bed without using bedrails?: A Little Help needed moving to and from a bed to a chair (including a wheelchair)?: A Little Help needed standing up from a chair using your arms (e.g., wheelchair or bedside chair)?: A Little Help needed to walk in hospital room?: A Little Help needed climbing 3-5 steps with a railing? : A Little 6 Click Score: 18    End of Session Equipment Utilized During Treatment: Gait belt Activity Tolerance: Patient tolerated treatment well Patient left: in chair;with call bell/phone within reach;with chair alarm set Nurse Communication: Mobility status PT Visit Diagnosis: Other abnormalities of gait and mobility (R26.89)    Time: 7622-6333 PT Time Calculation (min) (ACUTE ONLY): 25 min   Charges:   PT Evaluation $PT Eval Low Complexity: 1 Low PT Treatments $Gait Training: 8-22 mins       Paulino Door, DPT Acute Rehabilitation Services Pager: [phone] Office: [phone]  Maida Sale E 07/28/2020, 1:46 PM

## 2020-07-28 NOTE — Anesthesia Postprocedure Evaluation (Signed)
Anesthesia Post Note  Patient: Bobby James  Procedure(s) Performed: TOTAL KNEE ARTHROPLASTY (Right Knee)     Patient location during evaluation: PACU Anesthesia Type: MAC and Spinal Level of consciousness: oriented and awake and alert Pain management: pain level controlled Vital Signs Assessment: post-procedure vital signs reviewed and stable Respiratory status: spontaneous breathing, respiratory function stable and patient connected to nasal cannula oxygen Cardiovascular status: blood pressure returned to baseline and stable Postop Assessment: no headache, no backache and no apparent nausea or vomiting Anesthetic complications: no   No complications documented.  Last Vitals:  Vitals:   07/28/20 0518 07/28/20 1020  BP: 121/78 130/78  Pulse: 65 64  Resp: 18 16  Temp: 36.8 C 36.7 C  SpO2: 99% 94%    Last Pain:  Vitals:   07/28/20 0900  TempSrc:   PainSc: 2    Pain Goal: Patients Stated Pain Goal: 4 (07/27/20 1937)                 Burnetta Kohls

## 2020-07-28 NOTE — Progress Notes (Signed)
Physical Therapy Treatment Patient Details Name: Bobby James MRN: [id number] DOB: 05-16-1966 Today's Date: 07/28/2020    History of Present Illness Pt is a 54 year old male s/p R TKA    PT Comments    Pt ambulated in hallway and practiced safe stair technique again.  Pt used bathroom prior to return to bed.  Pt provided with HEP handout and had no further questions.  Pt feels ready for d/c home today.   Follow Up Recommendations  Follow surgeon's recommendation for DC plan and follow-up therapies     Equipment Recommendations  None recommended by PT    Recommendations for Other Services       Precautions / Restrictions Precautions Precautions: Knee;Fall Precaution Comments: able to perform SLR Restrictions Weight Bearing Restrictions: No    Mobility  Bed Mobility Overal bed mobility: Needs Assistance Bed Mobility: Supine to Sit;Sit to Supine     Supine to sit: HOB elevated;Supervision Sit to supine: Supervision;HOB elevated   General bed mobility comments: verbal cues for technique  Transfers Overall transfer level: Needs assistance Equipment used: Rolling walker (2 wheeled) Transfers: Sit to/from Stand Sit to Stand: Min guard;Supervision         General transfer comment: verbal cues for UE and LE positioning  Ambulation/Gait Ambulation/Gait assistance: Min guard;Supervision Gait Distance (Feet): 180 Feet Assistive device: Rolling walker (2 wheeled) Gait Pattern/deviations: Step-to pattern;Decreased stance time - right;Decreased weight shift to right;Antalgic     General Gait Details: verbal cues for sequence, RW positioning, posture, heel strike, pt taking big steps and keeping RW further away from body, corrected pattern however pt prefers his own technique, steady and no LOB   Stairs Stairs: Yes Stairs assistance: Min guard;Supervision Stair Management: Step to pattern;Forwards;Two rails Number of Stairs: 4 General stair comments: verbal  cues for sequence and safety   Wheelchair Mobility    Modified Rankin (Stroke Patients Only)       Balance                                            Cognition Arousal/Alertness: Awake/alert Behavior During Therapy: WFL for tasks assessed/performed Overall Cognitive Status: Within Functional Limits for tasks assessed                                        Exercises      General Comments        Pertinent Vitals/Pain Pain Assessment: 0-10 Pain Score: 5  Pain Location: right knee Pain Descriptors / Indicators: Sore;Aching Pain Intervention(s): Repositioned;Monitored during session;Ice applied    Home Living Family/patient expects to be discharged to:: Private residence Living Arrangements: Alone Available Help at Discharge: Family (brother to assist upon d/c)   Home Access: Stairs to enter Entrance Stairs-Rails: Right Home Layout: Two level Home Equipment: Environmental consultant - 2 wheels;Bedside commode      Prior Function Level of Independence: Independent          PT Goals (current goals can now be found in the care plan section) Acute Rehab PT Goals PT Goal Formulation: With patient Time For Goal Achievement: 07/31/20 Potential to Achieve Goals: Good Progress towards PT goals: Progressing toward goals    Frequency    7X/week      PT Plan Current plan remains appropriate  Co-evaluation              AM-PAC PT "6 Clicks" Mobility   Outcome Measure  Help needed turning from your back to your side while in a flat bed without using bedrails?: A Little Help needed moving from lying on your back to sitting on the side of a flat bed without using bedrails?: A Little Help needed moving to and from a bed to a chair (including a wheelchair)?: A Little Help needed standing up from a chair using your arms (e.g., wheelchair or bedside chair)?: A Little Help needed to walk in hospital room?: A Little Help needed climbing 3-5  steps with a railing? : A Little 6 Click Score: 18    End of Session Equipment Utilized During Treatment: Gait belt Activity Tolerance: Patient tolerated treatment well Patient left: in bed;with call bell/phone within reach Nurse Communication: Mobility status PT Visit Diagnosis: Other abnormalities of gait and mobility (R26.89)     Time: 6144-3154 PT Time Calculation (min) (ACUTE ONLY): 17 min  Charges:  $Gait Training: 8-22 mins                    Paulino Door, DPT Acute Rehabilitation Services Pager: [phone] Office: [phone]  Maida Sale E 07/28/2020, 3:46 PM

## 2020-07-28 NOTE — Progress Notes (Signed)
     Subjective: 1 Day Post-Op s/p Procedure(s): TOTAL KNEE ARTHROPLASTY  Patient reports pain to be mild this morning. Was given pain medication in anticipation of PT this morning. Denies chest pain, SOB, Calf pain. No nausea/vomiting. No other complaints.    Objective:  PE: VITALS:   Vitals:   07/27/20 2058 07/27/20 2219 07/28/20 0241 07/28/20 0518  BP: (!) 147/88 (!) 157/83 115/77 121/78  Pulse: 83 87 72 65  Resp: 16 16 16 18   Temp: (!) 97.4 F (36.3 C) (!) 97.4 F (36.3 C) 98 F (36.7 C) 98.3 F (36.8 C)  TempSrc: Oral Oral Oral Oral  SpO2: 97% 96% 97% 99%  Weight:      Height:        ABD soft Neurovascular intact Sensation intact distally Intact pulses distally Dorsiflexion/Plantar flexion intact Incision: dressing C/D/I  LABS  Results for orders placed or performed during the hospital encounter of 07/27/20 (from the past 24 hour(s))  CBC     Status: Abnormal   Collection Time: 07/28/20  3:22 AM  Result Value Ref Range   WBC 17.2 (H) 4.0 - 10.5 K/uL   RBC 4.96 4.22 - 5.81 MIL/uL   Hemoglobin 13.9 13.0 - 17.0 g/dL   HCT 14/08/21 39 - 52 %   MCV 84.7 80.0 - 100.0 fL   MCH 28.0 26.0 - 34.0 pg   MCHC 33.1 30.0 - 36.0 g/dL   RDW 54.6 56.8 - 12.7 %   Platelets 256 150 - 400 K/uL   nRBC 0.0 0.0 - 0.2 %  Basic metabolic panel     Status: Abnormal   Collection Time: 07/28/20  3:22 AM  Result Value Ref Range   Sodium 136 135 - 145 mmol/L   Potassium 4.3 3.5 - 5.1 mmol/L   Chloride 105 98 - 111 mmol/L   CO2 23 22 - 32 mmol/L   Glucose, Bld 147 (H) 70 - 99 mg/dL   BUN 15 6 - 20 mg/dL   Creatinine, Ser 14/08/21 0.61 - 1.24 mg/dL   Calcium 8.9 8.9 - 0.01 mg/dL   GFR, Estimated 74.9 >44 mL/min   Anion gap 8 5 - 15    DG Knee Right Port  Result Date: 07/27/2020 CLINICAL DATA:  Right knee replacement EXAM: PORTABLE RIGHT KNEE - 1-2 VIEW COMPARISON:  MRI 04/29/2020 FINDINGS: Status post right knee replacement with intact hardware and normal alignment. No acute fracture  is seen. Knee effusion. Gas within the soft tissues consistent with recent surgery. IMPRESSION: Expected postsurgical changes of right knee replacement. Electronically Signed   By: 06/29/2020 M.D.   On: 07/27/2020 19:42    Assessment/Plan:  ostoearthritis right knee: 1 Day Post-Op s/p Procedure(s): TOTAL KNEE ARTHROPLASTY - patient does have increased WBC count this AM, most likely due to surgery - overall doing well - WBAT RLE - no drainage noted from dressing, reinforce as needed - aspirin for vte prophylaxis - continue current pain control regimen - follow up in our office in 2 weeks - Dispo: pending PT eval, likely d/c home today. Patient has walker and is set up for OPPT.   Contact information:   Weekdays 8-5 04-24-2006, Janine Ores [place] A fter hours and holidays please check [email or web address] for group call information for [facility]  [phone] 07/28/2020, 8:58 AM

## 2020-07-28 NOTE — Plan of Care (Signed)
Patient discharged home in stable condition 

## 2020-07-28 NOTE — Discharge Summary (Signed)
Discharge Summary  Patient ID: Bobby James MRN: [id number] DOB/AGE: 1965/12/22 54 y.o.  Admit date: 07/27/2020 Discharge date: 07/28/2020  Admission Diagnoses:  Osteoarthritis of right knee  Discharge Diagnoses:  Principal Problem:   Osteoarthritis of right knee Active Problems:   S/P TKR (total knee replacement), right   Past Medical History:  Diagnosis Date  . Arthritis     Surgeries: Procedure(s): TOTAL KNEE ARTHROPLASTY on 07/27/2020   Consultants (if any):   Discharged Condition: Improved  Hospital Course: [facility] James is an 54 y.o. male who was admitted 07/27/2020 with a diagnosis of Osteoarthritis of right knee and went to the operating room on 07/27/2020 and underwent the above named procedures.    He was given perioperative antibiotics:  Anti-infectives (From admission, onward)   Start     Dose/Rate Route Frequency Ordered Stop   07/27/20 2100  ceFAZolin (ANCEF) IVPB 2g/100 mL premix        2 g 200 mL/hr over 30 Minutes Intravenous Every 6 hours 07/27/20 1821 07/28/20 0538   07/27/20 1445  ceFAZolin (ANCEF) IVPB 1 g/50 mL premix  Status:  Discontinued       Note to Pharmacy: Pt had 2 grams need additional 1 gram for total 3 grams patient is in the OR. Please send to station 12   1 g 100 mL/hr over 30 Minutes Intravenous  Once 07/27/20 1441 07/27/20 1818   07/27/20 1145  ceFAZolin (ANCEF) IVPB 2g/100 mL premix        2 g 200 mL/hr over 30 Minutes Intravenous On call to O.R. 07/27/20 1139 07/27/20 1531    .  He was given sequential compression devices, early ambulation, and aspirin for DVT prophylaxis.  He benefited maximally from the hospital stay and there were no complications.    Recent vital signs:  Vitals:   07/28/20 0241 07/28/20 0518  BP: 115/77 121/78  Pulse: 72 65  Resp: 16 18  Temp: 98 F (36.7 C) 98.3 F (36.8 C)  SpO2: 97% 99%    Recent laboratory studies:  Lab Results  Component Value Date   HGB 13.9 07/28/2020   HGB  14.5 07/14/2020   HGB 14.6 05/23/2012   Lab Results  Component Value Date   WBC 17.2 (H) 07/28/2020   PLT 256 07/28/2020   No results found for: INR Lab Results  Component Value Date   NA 136 07/28/2020   K 4.3 07/28/2020   CL 105 07/28/2020   CO2 23 07/28/2020   BUN 15 07/28/2020   CREATININE 0.94 07/28/2020   GLUCOSE 147 (H) 07/28/2020    Discharge Medications:   Allergies as of 07/28/2020   No Known Allergies     Medication List    STOP taking these medications   meloxicam 15 MG tablet Commonly known as: MOBIC     TAKE these medications   aspirin EC 325 MG tablet Take 1 tablet (325 mg total) by mouth 2 (two) times daily.   baclofen 10 MG tablet Commonly known as: LIORESAL Take 1 tablet (10 mg total) by mouth 3 (three) times daily. As needed for muscle spasm   ondansetron 4 MG tablet Commonly known as: Zofran Take 1 tablet (4 mg total) by mouth every 8 (eight) hours as needed for nausea or vomiting.   oxyCODONE 5 MG immediate release tablet Commonly known as: Roxicodone Take 1 tablet (5 mg total) by mouth every 4 (four) hours as needed for severe pain.   sennosides-docusate sodium 8.6-50 MG  tablet Commonly known as: SENOKOT-S Take 2 tablets by mouth daily.       Diagnostic Studies: DG Knee Right Port  Result Date: 07/27/2020 CLINICAL DATA:  Right knee replacement EXAM: PORTABLE RIGHT KNEE - 1-2 VIEW COMPARISON:  MRI 04/29/2020 FINDINGS: Status post right knee replacement with intact hardware and normal alignment. No acute fracture is seen. Knee effusion. Gas within the soft tissues consistent with recent surgery. IMPRESSION: Expected postsurgical changes of right knee replacement. Electronically Signed   By: Jasmine Pang M.D.   On: 07/27/2020 19:42    Disposition:      Follow-up Information    Teryl Lucy, MD. Schedule an appointment as soon as possible for a visit in 2 weeks.   Specialty: Orthopedic Surgery Contact information: [address] [place] [address] [phone]                Signed: Annita Brod 07/28/2020, 9:51 AM

## 2020-07-29 ENCOUNTER — Encounter (HOSPITAL_COMMUNITY): Payer: Self-pay | Admitting: Orthopedic Surgery

## 2020-08-06 ENCOUNTER — Encounter (HOSPITAL_COMMUNITY): Payer: Self-pay | Admitting: Orthopedic Surgery

## 2020-12-09 IMAGING — MR MR KNEE*R* W/O CM
7 series · 36 of 40 positions shown · non-contrast
Comparison: None.
COMPARISON: None.

Addendum:
CLINICAL DATA: Severe right knee pain for 1 year.

EXAM:
MRI OF THE RIGHT KNEE WITHOUT CONTRAST
TECHNIQUE: Multiplanar, multisequence MR imaging of the knee was performed. No
intravenous contrast was administered.

[Series 6: T2 fat-sat · axial · right · 4.0mm · 0.50mm/px · z∈[+15,+169]mm · 6 of 36 slices shown (1 of 3)]
[im 1/36]
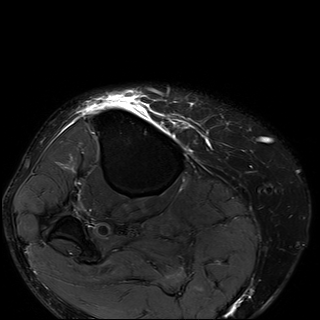
[im 8/36]
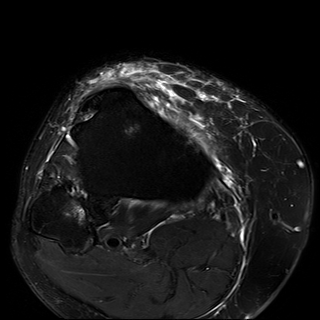
[im 15/36]
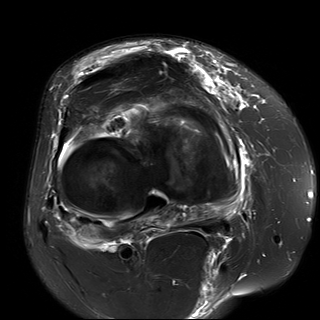
[im 22/36]
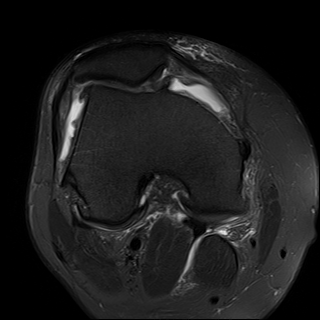
[im 29/36]
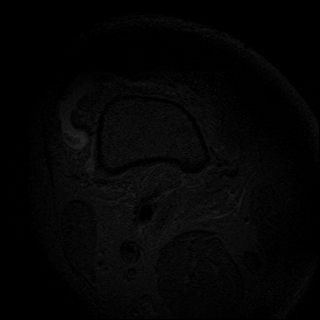
[im 36/36]
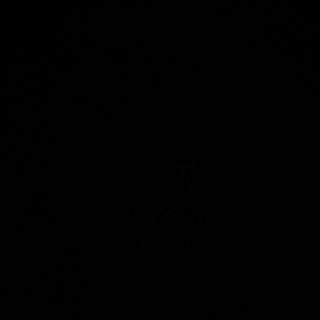

[Series 7: T2 fat-sat · coronal · right · 4.0mm · 0.39mm/px · 6 of 28 slices shown (2 of 3)]
[im 1/28]
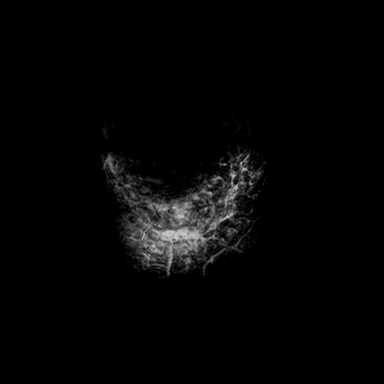
[im 6/28]
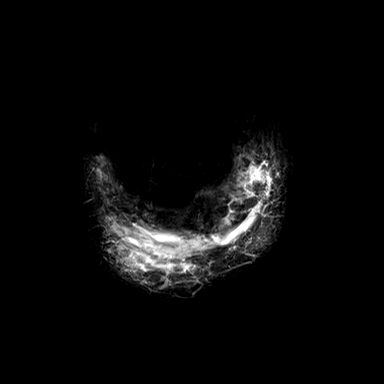
[im 11/28]
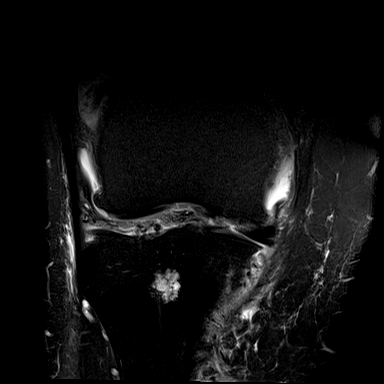
[im 17/28]
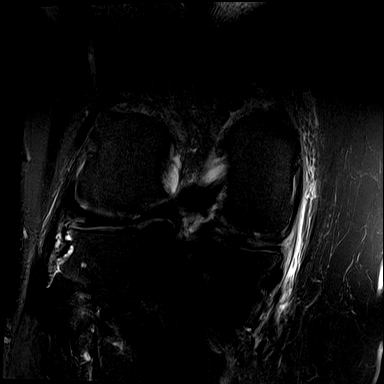
[im 22/28]
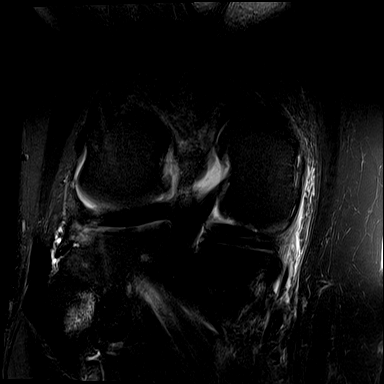
[im 28/28]
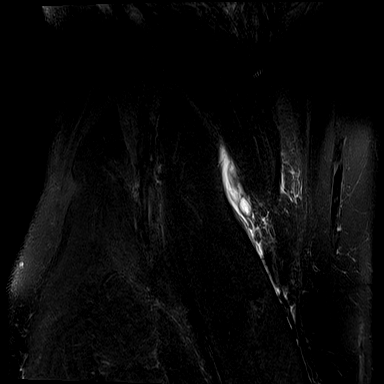

[Series 8: T1 · coronal · right · 4.0mm · 0.39mm/px · 2 of 28 slices shown]
[im 1/28]
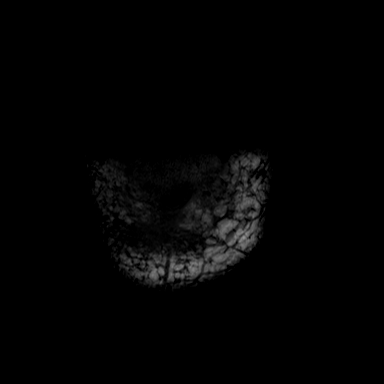
[im 6/28]
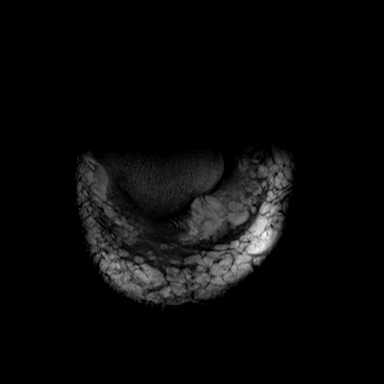

[Series 9: PD fat-sat · coronal · right · 3.0mm · 0.47mm/px · 6 of 28 slices shown (1 of 2)]
[im 1/28]
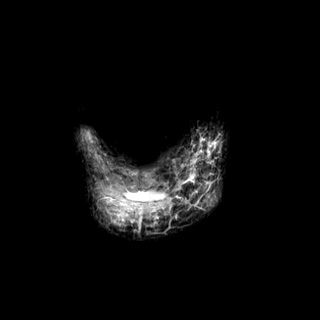
[im 6/28]
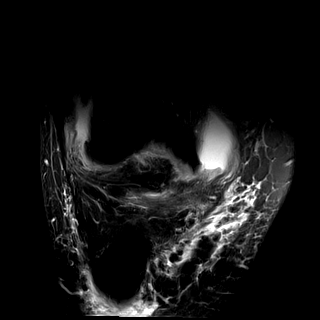
[im 11/28]
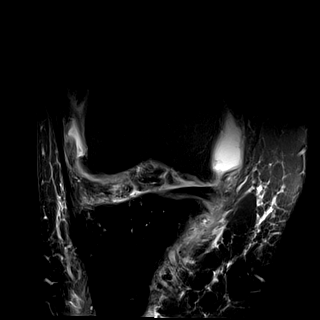
[im 17/28]
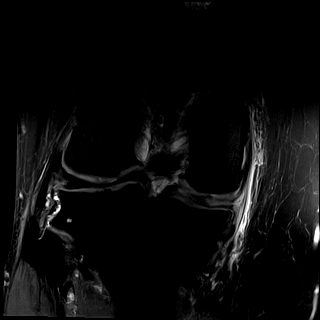
[im 22/28]
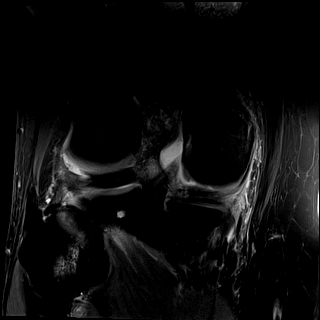
[im 28/28]
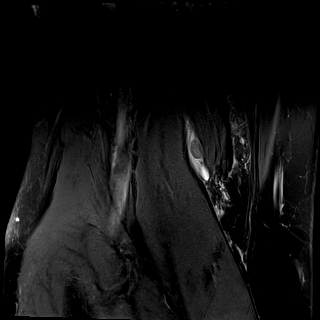

[Series 10: PD fat-sat · sagittal · right · 3.0mm · 0.39mm/px · 6 of 29 slices shown (2 of 2)]
[im 1/29]
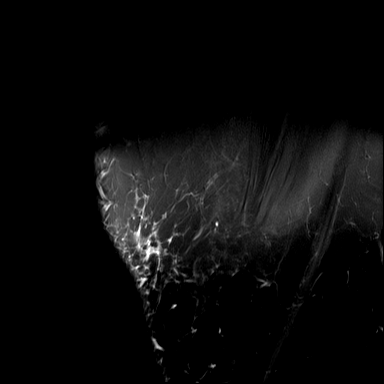
[im 6/29]
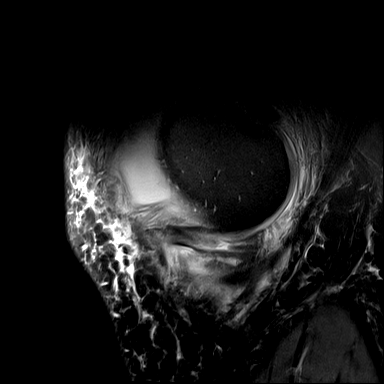
[im 12/29]
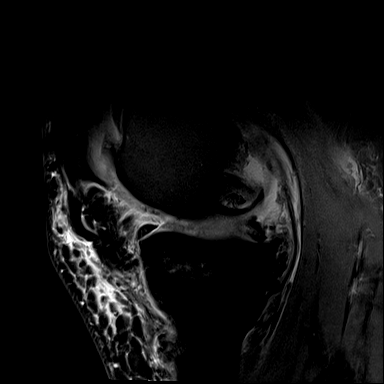
[im 17/29]
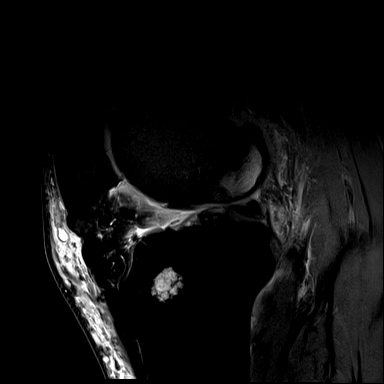
[im 23/29]
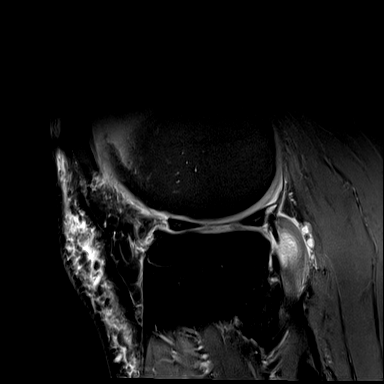
[im 29/29]
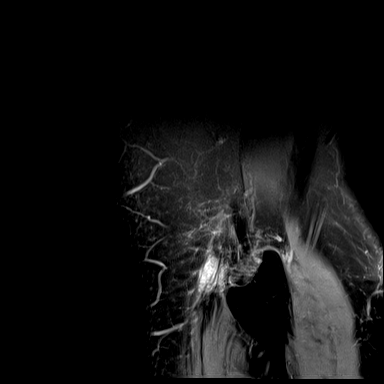

[Series 11: T2 fat-sat · sagittal · right · 3.0mm · 0.39mm/px · 6 of 29 slices shown (3 of 3)]
[im 1/29]
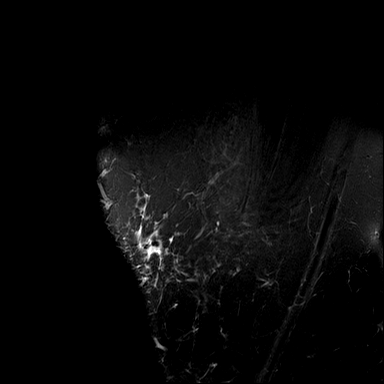
[im 6/29]
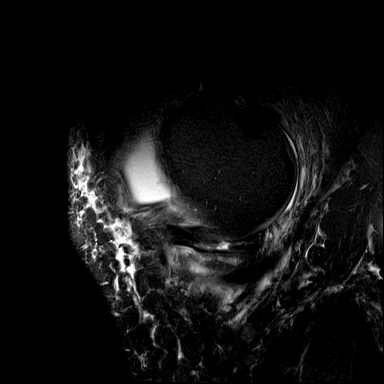
[im 12/29]
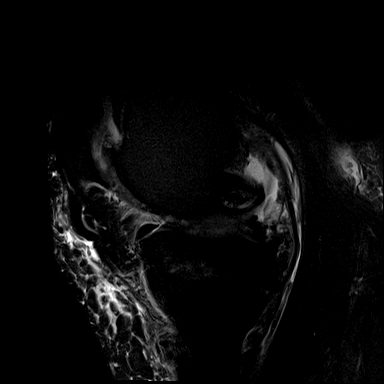
[im 17/29]
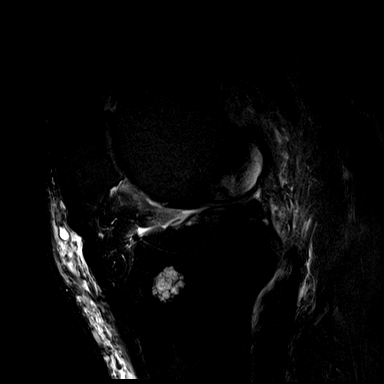
[im 23/29]
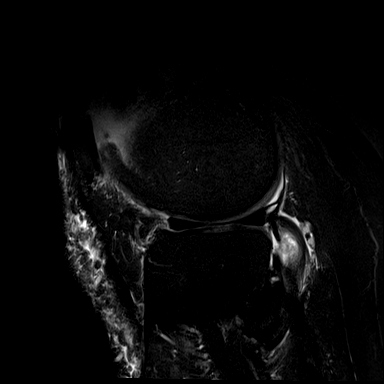
[im 29/29]
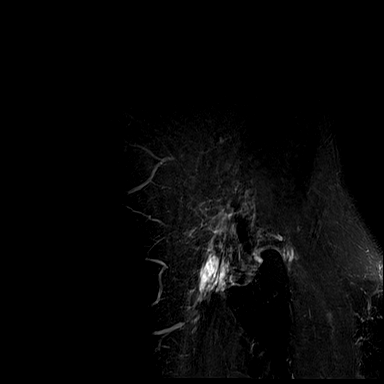

[Series 12: PD · oblique · right · 1.5mm · 0.55mm/px · 4 of 21 slices shown]
[im 1/21]
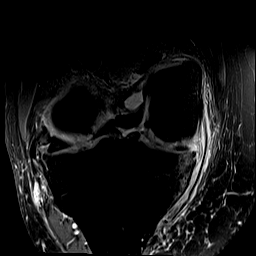
[im 7/21]
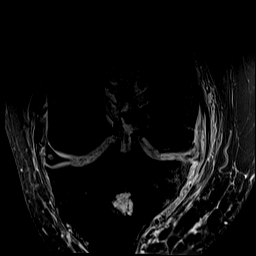
[im 14/21]
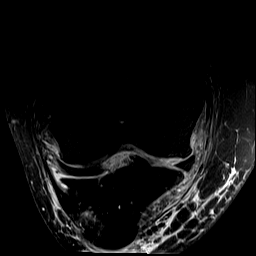
[im 21/21]
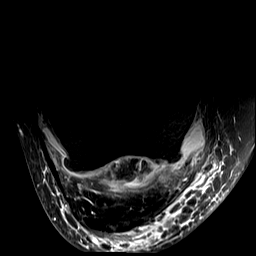

[36 of 40 positions shown; findings below may reference images not displayed]

FINDINGS: MENISCI

Medial meniscus: Severe complex tearing is seen in the posterior
horn and posterior body. Virtually no normal-appearing meniscal
tissue is seen in posterior body.

Lateral meniscus:  Intact.

LIGAMENTS

Cruciates:  Intact.

Collaterals: Intact. There is some fluid about the MCL consistent
with grade 1 sprain which is likely due to altered mechanics.

CARTILAGE

Patellofemoral:  Diffusely thinned and irregular.

Medial:  Severely degenerated.

Lateral:  Moderately degenerated.

Joint:  Small joint effusion.

Popliteal Fossa: Small Baker's cyst. Fluid has dissected out of the
cyst along the gastrocnemius.

Extensor Mechanism: The quadriceps tendon is poorly seen due to
artifact. The extensor mechanism appears intact.

Bones: Bulky tricompartmental osteophytosis. Subchondral edema about
the medial compartment is worse in the tibia. The patient also has
subchondral edema at the proximal tib-fib joint from degenerative
disease. Small enchondroma in the proximal tibia incidentally noted.

Other: None.
IMPRESSION: Dominant finding is advanced for age tricompartmental
osteoarthritis. Associated complex tearing in the posterior horn
body of the medial meniscus is seen as described above.

Grade 1 MCL sprain is likely due to altered mechanics. The ligament
is intact.

Advanced for age osteoarthritis proximal tib-fib joint.

Small ruptured Baker's cyst with fluid tracking along the medial
gastrocnemius. Volume of fluid appears small.

ADDENDUM:
This addendum is given for the purpose of noting that the patient
has a loose body in the anterior aspect of the lateral compartment
measuring 1.2 cm in diameter.

*** End of Addendum ***
FINDINGS: MENISCI

Medial meniscus: Severe complex tearing is seen in the posterior
horn and posterior body. Virtually no normal-appearing meniscal
tissue is seen in posterior body.

Lateral meniscus:  Intact.

LIGAMENTS

Cruciates:  Intact.

Collaterals: Intact. There is some fluid about the MCL consistent
with grade 1 sprain which is likely due to altered mechanics.

CARTILAGE

Patellofemoral:  Diffusely thinned and irregular.

Medial:  Severely degenerated.

Lateral:  Moderately degenerated.

Joint:  Small joint effusion.

Popliteal Fossa: Small Baker's cyst. Fluid has dissected out of the
cyst along the gastrocnemius.

Extensor Mechanism: The quadriceps tendon is poorly seen due to
artifact. The extensor mechanism appears intact.

Bones: Bulky tricompartmental osteophytosis. Subchondral edema about
the medial compartment is worse in the tibia. The patient also has
subchondral edema at the proximal tib-fib joint from degenerative
disease. Small enchondroma in the proximal tibia incidentally noted.

Other: None.
IMPRESSION: Dominant finding is advanced for age tricompartmental
osteoarthritis. Associated complex tearing in the posterior horn
body of the medial meniscus is seen as described above.

Grade 1 MCL sprain is likely due to altered mechanics. The ligament
is intact.

Advanced for age osteoarthritis proximal tib-fib joint.

Small ruptured Baker's cyst with fluid tracking along the medial
gastrocnemius. Volume of fluid appears small.

## 2021-03-08 IMAGING — DX DG KNEE 1-2V PORT*R*
2 series · 2 of 2 positions shown · non-contrast
Comparison: MRI 04/29/2020

CLINICAL DATA: Right knee replacement

EXAM:
PORTABLE RIGHT KNEE - 1-2 VIEW

[knee ap]
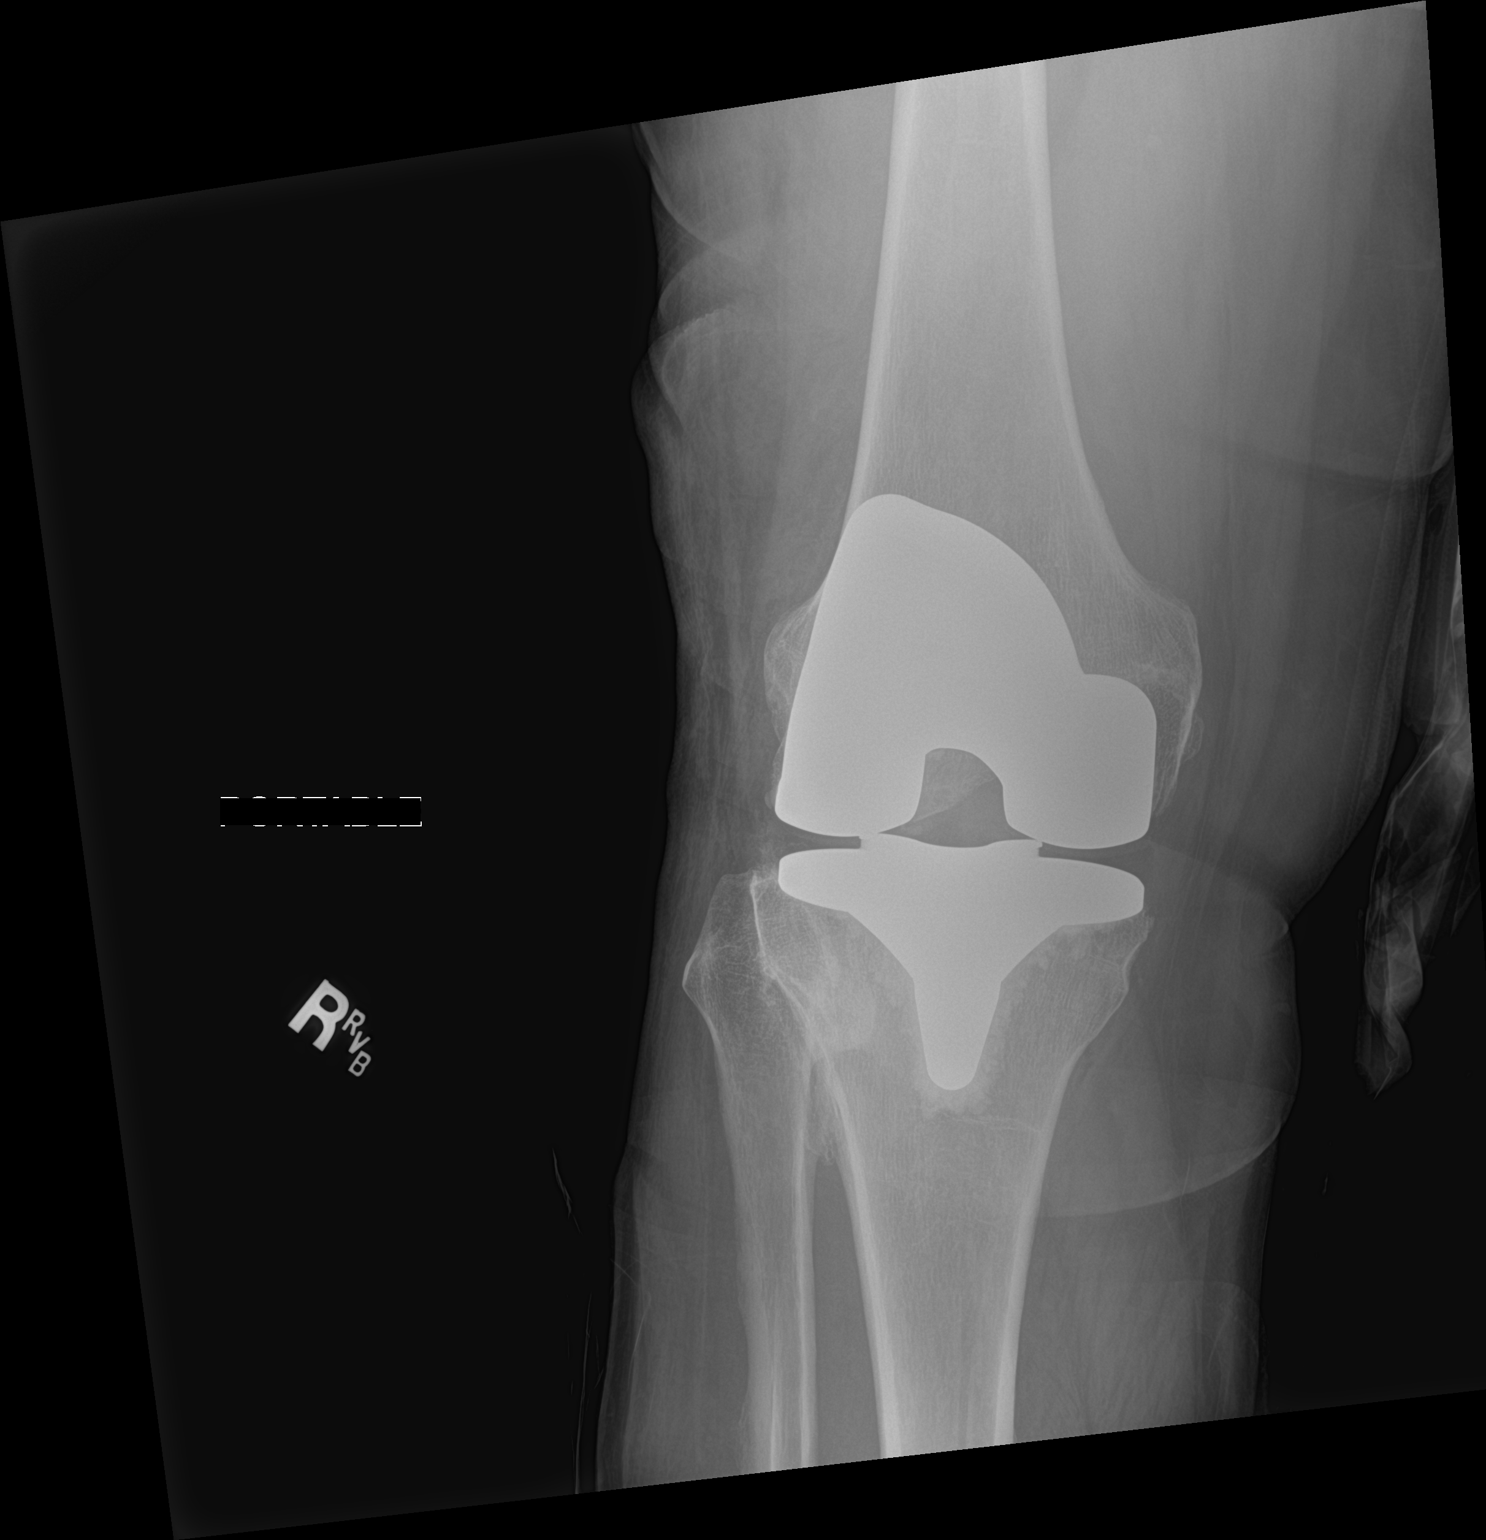

[knee lat]
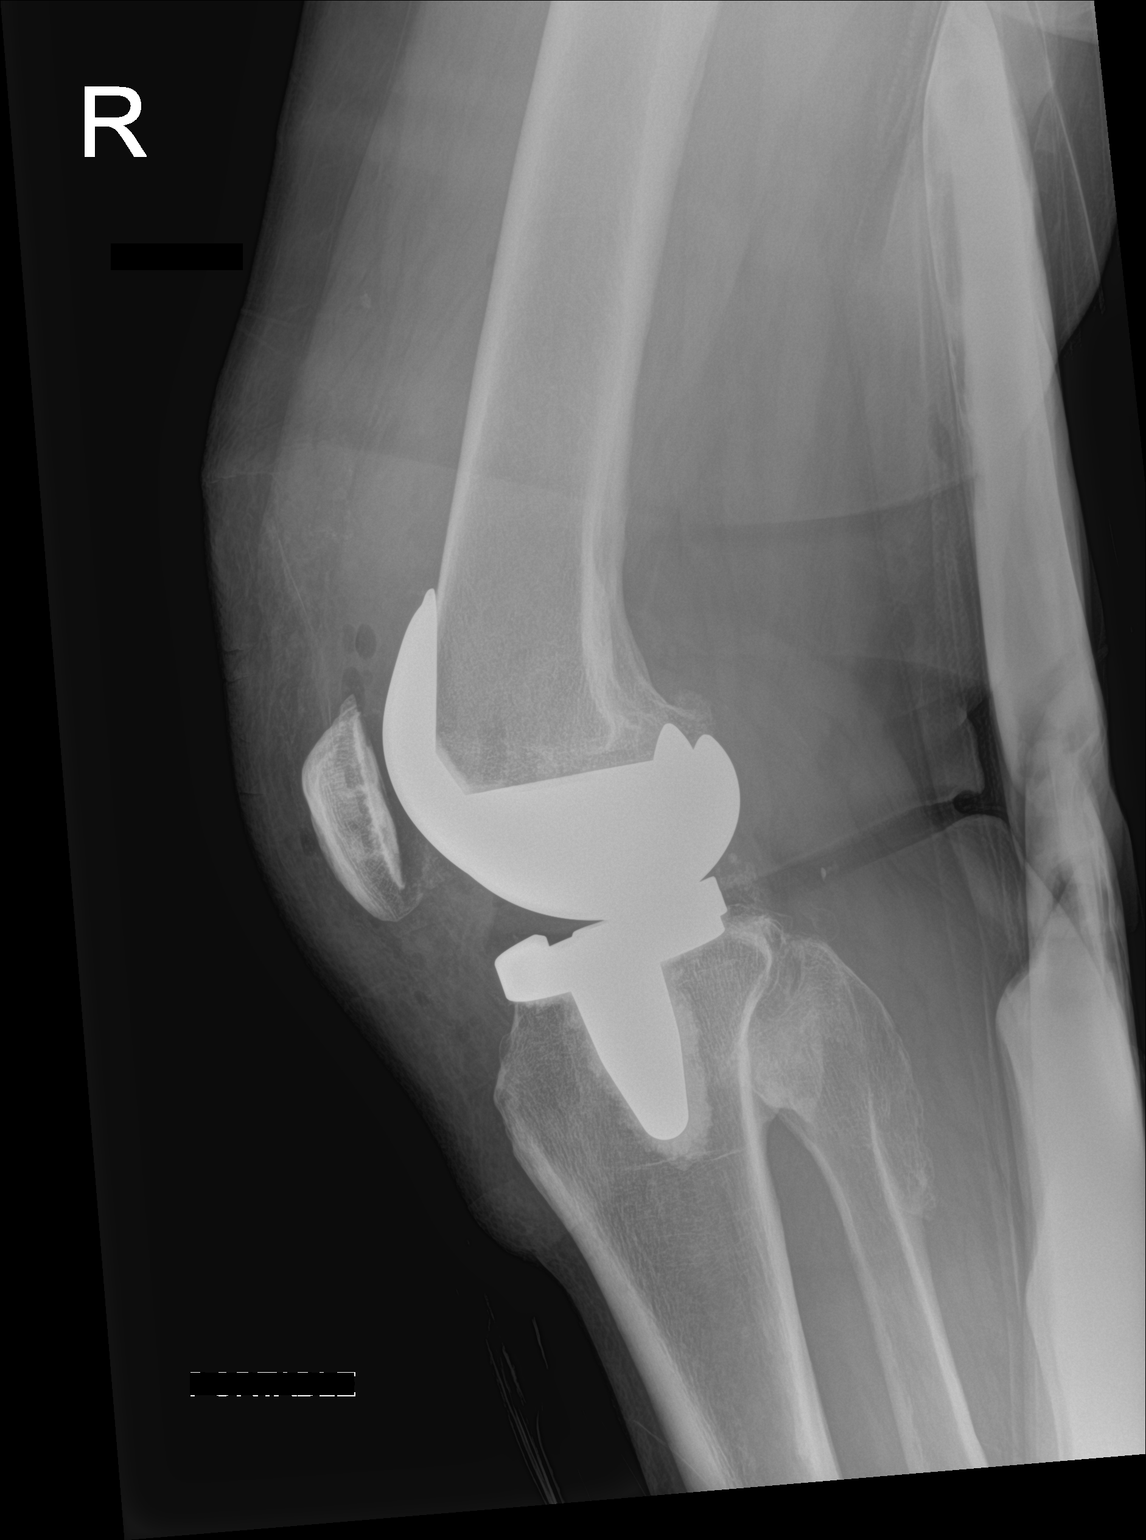

[2 of 2 positions shown; findings below may reference images not displayed]

FINDINGS: Status post right knee replacement with intact hardware and normal
alignment. No acute fracture is seen. Knee effusion. Gas within the
soft tissues consistent with recent surgery.
IMPRESSION: Expected postsurgical changes of right knee replacement.

## 2022-06-20 ENCOUNTER — Encounter (HOSPITAL_BASED_OUTPATIENT_CLINIC_OR_DEPARTMENT_OTHER): Payer: Self-pay

## 2022-06-20 ENCOUNTER — Emergency Department (HOSPITAL_BASED_OUTPATIENT_CLINIC_OR_DEPARTMENT_OTHER): Payer: Commercial Managed Care - HMO

## 2022-06-20 ENCOUNTER — Other Ambulatory Visit: Payer: Self-pay

## 2022-06-20 ENCOUNTER — Emergency Department (HOSPITAL_BASED_OUTPATIENT_CLINIC_OR_DEPARTMENT_OTHER): Payer: Commercial Managed Care - HMO | Admitting: Radiology

## 2022-06-20 ENCOUNTER — Emergency Department (HOSPITAL_BASED_OUTPATIENT_CLINIC_OR_DEPARTMENT_OTHER)
Admission: EM | Admit: 2022-06-20 | Discharge: 2022-06-20 | Disposition: A | Discharge status: Home / Self Care | Payer: Commercial Managed Care - HMO | Attending: Emergency Medicine | Admitting: Emergency Medicine

## 2022-06-20 DIAGNOSIS — Z7982 Long term (current) use of aspirin: Secondary | ICD-10-CM | POA: Diagnosis not present

## 2022-06-20 DIAGNOSIS — N5089 Other specified disorders of the male genital organs: Secondary | ICD-10-CM | POA: Diagnosis not present

## 2022-06-20 LAB — CBC WITH DIFFERENTIAL/PLATELET
Abs Immature Granulocytes: 0.06 10*3/uL (ref 0.00–0.07)
Basophils Absolute: 0.1 10*3/uL (ref 0.0–0.1)
Basophils Relative: 1 %
Eosinophils Absolute: 0.3 10*3/uL (ref 0.0–0.5)
Eosinophils Relative: 3 %
HCT: 44.4 % (ref 39.0–52.0)
Hemoglobin: 14.5 g/dL (ref 13.0–17.0)
Immature Granulocytes: 1 %
Lymphocytes Relative: 31 %
Lymphs Abs: 2.8 10*3/uL (ref 0.7–4.0)
MCH: 26.4 pg (ref 26.0–34.0)
MCHC: 32.7 g/dL (ref 30.0–36.0)
MCV: 80.9 fL (ref 80.0–100.0)
Monocytes Absolute: 0.7 10*3/uL (ref 0.1–1.0)
Monocytes Relative: 7 %
Neutro Abs: 5.2 10*3/uL (ref 1.7–7.7)
Neutrophils Relative %: 57 %
Platelets: 277 10*3/uL (ref 150–400)
RBC: 5.49 MIL/uL (ref 4.22–5.81)
RDW: 14.6 % (ref 11.5–15.5)
WBC: 9 10*3/uL (ref 4.0–10.5)
nRBC: 0 % (ref 0.0–0.2)

## 2022-06-20 LAB — BASIC METABOLIC PANEL
Anion gap: 7 (ref 5–15)
BUN: 20 mg/dL (ref 6–20)
CO2: 26 mmol/L (ref 22–32)
Calcium: 9.5 mg/dL (ref 8.9–10.3)
Chloride: 105 mmol/L (ref 98–111)
Creatinine, Ser: 1.04 mg/dL (ref 0.61–1.24)
GFR, Estimated: >60 mL/min (ref >60)
Glucose, Bld: 89 mg/dL (ref 70–99)
Potassium: 3.9 mmol/L (ref 3.5–5.1)
Sodium: 138 mmol/L (ref 135–145)

## 2022-06-20 LAB — BRAIN NATRIURETIC PEPTIDE: B Natriuretic Peptide: 34.2 pg/mL (ref 0.0–100.0)

## 2022-06-20 NOTE — ED Triage Notes (Cosign Needed)
Pt states started 4 days ago scrotum pain with cyst enlarging. Denies fevers or urinating.

## 2022-06-20 NOTE — Discharge Instructions (Signed)
You have been evaluated for your scrotal swelling.  Fortunately no concerning finding were noted on today's exam. Wear supportive underwear for relief. Follow up with your doctor for further care.  Return if you develop shortness of breath, leg swelling, testicular pain or if you have other concerns.

## 2022-06-20 NOTE — ED Provider Notes (Signed)
[facility] EMERGENCY DEPT Provider Note   CSN: [id number] Arrival date & time: 06/20/22  1344     History  Chief Complaint  Patient presents with   Groin Swelling    Bobby James is a 56 y.o. male.  The history is provided by the patient and medical records. No language interpreter was used.     56 year old male with history of skin abscess presenting complaining of scrotal swelling.  Patient report for the past 5 days he noticed gradual onset of progressive worsening swelling about the scrotum.  He does not have any significant pain associated with the swelling.  No complaints of fever or chills no chest pain no shortness of breath no leg swelling.  He denies any injury.  He denies any penile discharge or testicular pain.  No trauma.  This is never happened before.  No change in medication and no history of CHF.  Home Medications Prior to Admission medications   Medication Sig Start Date End Date Taking? Authorizing Provider  aspirin EC 325 MG tablet Take 1 tablet (325 mg total) by mouth 2 (two) times daily. 07/27/20   Armida Sans, PA-C  baclofen (LIORESAL) 10 MG tablet Take 1 tablet (10 mg total) by mouth 3 (three) times daily. As needed for muscle spasm 07/27/20   Janine Ores K, PA-C  ondansetron (ZOFRAN) 4 MG tablet Take 1 tablet (4 mg total) by mouth every 8 (eight) hours as needed for nausea or vomiting. 07/27/20   Armida Sans, PA-C  oxyCODONE (ROXICODONE) 5 MG immediate release tablet Take 1 tablet (5 mg total) by mouth every 4 (four) hours as needed for severe pain. 07/27/20   Janine Ores K, PA-C  sennosides-docusate sodium (SENOKOT-S) 8.6-50 MG tablet Take 2 tablets by mouth daily. 07/27/20   Armida Sans, PA-C      Allergies    Patient has no known allergies.    Review of Systems   Review of Systems  All other systems reviewed and are negative.   Physical Exam Updated Vital Signs BP (!) 146/80 (BP Location: Right Arm)   Pulse 79    Temp 98 F (36.7 C) (Oral)   Resp 19   Ht 5\' 10"  (1.778 m)   Wt 131.5 kg   SpO2 96%   BMI 41.61 kg/m  Physical Exam Vitals and nursing note reviewed.  Constitutional:      General: He is not in acute distress.    Appearance: He is well-developed. He is obese.  HENT:     Head: Atraumatic.  Eyes:     Conjunctiva/sclera: Conjunctivae normal.  Cardiovascular:     Rate and Rhythm: Normal rate and regular rhythm.     Pulses: Normal pulses.     Heart sounds: Normal heart sounds.  Pulmonary:     Breath sounds: No wheezing, rhonchi or rales.  Abdominal:     Palpations: Abdomen is soft.     Tenderness: There is no abdominal tenderness.  Genitourinary:    Comments: Chaperone present during exam.  Circumcised penis with some appreciable edema along the shaft of the penis.  Moderate scrotal swelling noted without any significant tenderness.  No erythema no obvious abscess appreciated.  Testicles palpable and nontender. Musculoskeletal:     Cervical back: Neck supple.  Skin:    Findings: No rash.  Neurological:     Mental Status: He is alert.     ED Results / Procedures / Treatments   Labs (all labs ordered are  listed, but only abnormal results are displayed) Labs Reviewed - No data to display  EKG None  Radiology No results found.  Procedures Procedures    Medications Ordered in ED Medications - No data to display  ED Course/ Medical Decision Making/ A&P                           Medical Decision Making Amount and/or Complexity of Data Reviewed Labs: ordered. Radiology: ordered.   BP 134/79   Pulse 65   Temp 98 F (36.7 C) (Oral)   Resp 18   Ht 5\' 10"  (1.778 m)   Wt 131.5 kg   SpO2 97%   BMI 41.61 kg/m   49:19 PM  56 year old male with history of skin abscess presenting complaining of scrotal swelling.  Patient report for the past 5 days he noticed gradual onset of progressive worsening swelling about the scrotum.  He does not have any significant pain  associated with the swelling.  No complaints of fever or chills no chest pain no shortness of breath no leg swelling.  He denies any injury.  He denies any penile discharge or testicular pain.  No trauma.  This is never happened before.  No change in medication and no history of CHF.  On exam this is an obese male resting comfortably in the bed appears to be in no acute discomfort.  Exam of his genitalia with chaperone present was noted for dependent edema to the scrotum without any obvious cellulitis or abscess.  Testicles palpable and nontender.  He does not have any significant peripheral edema his lungs are clear he does not have any obvious fluid retention elsewhere.  HPI -Labs ordered, independently viewed and interpreted by me.  Labs remarkable for reassuring labs with normal BNP, normal renal function -EKG interpreted by me showing NSR -imaging independently viewed and interpreted by me and I agree with radiologist's interpretation.  Result remarkable for scrotal edema -DDx: scrotal edema, abscess, cellulitis, testicular torsion, CHF -treatment includes supportive underwear -PCP office notes or outside notes reviewed -Discussion with attending Dr. Wyvonnia Dusky -Escalation to admission/observation considered: patients feels much better, is comfortable with discharge, and will follow up with PCP -Prescription medication considered, patient comfortable with supportive underwewar          Final Clinical Impression(s) / ED Diagnoses Final diagnoses:  Scrotal edema    Rx / DC Orders ED Discharge Orders     None         Domenic Moras, PA-C 06/20/22 1820    Ezequiel Essex, MD 06/21/22 [phone]

## 2022-06-20 NOTE — ED Notes (Signed)
ED PA [facility] Provider at bedside.

## 2023-03-01 ENCOUNTER — Encounter: Payer: Self-pay | Admitting: Cardiology

## 2023-03-01 ENCOUNTER — Ambulatory Visit: Payer: Commercial Managed Care - HMO | Admitting: Cardiology

## 2023-03-01 VITALS — BP 131/82 | HR 85 | Resp 14 | Ht 70.0 in | Wt 297.2 lb

## 2023-03-01 DIAGNOSIS — R011 Cardiac murmur, unspecified: Secondary | ICD-10-CM

## 2023-03-01 DIAGNOSIS — R0989 Other specified symptoms and signs involving the circulatory and respiratory systems: Secondary | ICD-10-CM

## 2023-03-01 NOTE — Progress Notes (Signed)
Patient referred by Lewis Moccasin, MD for murmur  Subjective:   Bobby James, male    DOB: Jan 02, 1966, 57 y.o.   MRN: [id number]   Chief Complaint  Patient presents with   Heart Murmur   New Patient (Initial Visit)    HPI  57 y.o. Caucasian male with murmur  Patient does a lot of walking at work, denies chest pain, shortness of breath, palpitations, leg edema, orthopnea, PND, TIA/syncope. He is known to have murmur for a long time, but has never had echocardiogram.    Past Medical History:  Diagnosis Date   Arthritis      Past Surgical History:  Procedure Laterality Date   INCISION AND DRAINAGE PERIRECTAL ABSCESS  05/22/2012   Procedure: IRRIGATION AND DEBRIDEMENT PERIRECTAL ABSCESS;  Surgeon: Mariella Saa, MD;  Location: [facility];  Service: General;  Laterality: N/A;  Excision Perineal Abscess   LAPAROSCOPIC GASTRIC BANDING  2009   LASIK     right eye   right carpal tunnel release      right heel spur surgery      ROTATOR CUFF REPAIR     right shoulder   TONSILLECTOMY     as child   TOTAL KNEE ARTHROPLASTY Right 07/27/2020   Procedure: TOTAL KNEE ARTHROPLASTY;  Surgeon: Teryl Lucy, MD;  Location: [facility];  Service: Orthopedics;  Laterality: Right;     Social History   Tobacco Use  Smoking Status Never  Smokeless Tobacco Never    Social History   Substance and Sexual Activity  Alcohol Use Never     History reviewed. No pertinent family history.    Current Outpatient Medications:    COSENTYX SENSOREADY, 300 MG, 150 MG/ML SOAJ, Inject into the skin every 30 (thirty) days., Disp: , Rfl:    Cardiovascular and other pertinent studies:  Reviewed external labs and tests, independently interpreted  EKG 03/01/2023: Sinus rhythm 84 bpm  Normal EKG   Recent labs: 06/20/2022: Glucose 89, BUN/Cr 20/1.04. EGFR >60. Na/K 138/3.9.  BNP 34 normal H/H 14/44. MCV 80. Platelets 277   Review of Systems  Cardiovascular:  Negative for  chest pain, dyspnea on exertion, leg swelling, palpitations and syncope.         Vitals:   03/01/23 1450  BP: 131/82  Pulse: 85  Resp: 14  SpO2: 95%     Body mass index is 42.64 kg/m. Filed Weights   03/01/23 1450  Weight: 297 lb 3.2 oz (134.8 kg)     Objective:   Physical Exam Vitals and nursing note reviewed.  Constitutional:      General: He is not in acute distress. Neck:     Vascular: No JVD.  Cardiovascular:     Rate and Rhythm: Normal rate and regular rhythm.     Pulses:          Carotid pulses are  on the right side with bruit.    Heart sounds: Murmur heard.     Harsh midsystolic murmur is present with a grade of 3/6 at the upper right sternal border radiating to the neck.  Pulmonary:     Effort: Pulmonary effort is normal.     Breath sounds: Normal breath sounds. No wheezing or rales.  Musculoskeletal:     Right lower leg: No edema.     Left lower leg: No edema.            Visit diagnoses:   ICD-10-CM   1. Murmur  R01.1 EKG  12-Lead    PCV ECHOCARDIOGRAM COMPLETE    2. Bruit  R09.89 PCV CAROTID DUPLEX (BILATERAL)       Orders Placed This Encounter  Procedures   EKG 12-Lead   PCV ECHOCARDIOGRAM COMPLETE   PCV CAROTID DUPLEX (BILATERAL)     Medication changes this visit: Medications Discontinued During This Encounter  Medication Reason   aspirin EC 325 MG tablet    baclofen (LIORESAL) 10 MG tablet    ondansetron (ZOFRAN) 4 MG tablet    oxyCODONE (ROXICODONE) 5 MG immediate release tablet    sennosides-docusate sodium (SENOKOT-S) 8.6-50 MG tablet        Assessment & Recommendations:    57 y.o. Caucasian male with murmur  Ejection systolic murmur suspicious for aortic stenosis. Given his relatively young age, suspect bicuspid valve. Carotid bruit possibly radiated murmur. Check echocardiogram, carotid duplex. Will likely discontinue Aspirin 325 mg at next visit.   Further recommendations after above tests.    Thank you for  referring the patient to [place]. Please feel free to contact with any questions.   Elder Negus, MD Pager: [phone] Office: [phone]

## 2023-03-08 ENCOUNTER — Ambulatory Visit: Payer: Commercial Managed Care - HMO

## 2023-03-08 DIAGNOSIS — R011 Cardiac murmur, unspecified: Secondary | ICD-10-CM

## 2023-03-08 DIAGNOSIS — R0989 Other specified symptoms and signs involving the circulatory and respiratory systems: Secondary | ICD-10-CM

## 2023-04-05 ENCOUNTER — Encounter: Payer: Self-pay | Admitting: Cardiology

## 2023-04-05 ENCOUNTER — Ambulatory Visit: Payer: Commercial Managed Care - HMO | Admitting: Cardiology

## 2023-04-05 VITALS — BP 144/88 | HR 80 | Resp 16 | Ht 70.0 in | Wt 293.0 lb

## 2023-04-05 DIAGNOSIS — I350 Nonrheumatic aortic (valve) stenosis: Secondary | ICD-10-CM

## 2023-04-05 NOTE — Progress Notes (Signed)
    Patient referred by Elder Negus, MD for murmur  Subjective:   Bobby James, male    DOB: 16-Jul-1966, 57 y.o.   MRN: [id number]   Chief Complaint  Patient presents with   Heart Murmur   Follow-up    4 week    HPI  57 y.o. Caucasian male with murmur  Patient does a lot of walking at work, denies chest pain, shortness of breath, palpitations, leg edema, orthopnea, PND, TIA/syncope. He is known to have murmur for a long time, but has never had echocardiogram.    Current Outpatient Medications:    COSENTYX SENSOREADY, 300 MG, 150 MG/ML SOAJ, Inject into the skin every 30 (thirty) days., Disp: , Rfl:    Cardiovascular and other pertinent studies:  Reviewed external labs and tests, independently interpreted  EKG 03/01/2023: Sinus rhythm 84 bpm  Normal EKG  Echocardiogram 03/08/2023: Left ventricle cavity is normal in size. Moderate concentric hypertrophy of the left ventricle. Normal global wall motion. Normal LV systolic function with EF 58%. Indeterminate diastolic filling pattern. Trileaflet aortic valve with mild aortic valve leaflet calcification. Mild aortic stenosis. Vmax 2.6 m/sec, mean PG 15 mmHg, AVA 1.6 cm by continuity equation. No regurgitation. No evidence of pulmonary hypertension.  Recent labs: 06/20/2022: Glucose 89, BUN/Cr 20/1.04. EGFR >60. Na/K 138/3.9.  BNP 34 normal H/H 14/44. MCV 80. Platelets 277   Review of Systems  Cardiovascular:  Negative for chest pain, dyspnea on exertion, leg swelling, palpitations and syncope.         Vitals:   04/05/23 1525  BP: (!) 144/88  Pulse: 80  Resp: 16  SpO2: 98%      Body mass index is 42.04 kg/m. Filed Weights   04/05/23 1525  Weight: 293 lb (132.9 kg)      Objective:   Physical Exam Vitals and nursing note reviewed.  Constitutional:      General: He is not in acute distress. Neck:     Vascular: No JVD.  Cardiovascular:     Rate and Rhythm: Normal rate and regular  rhythm.     Pulses:          Carotid pulses are  on the right side with bruit.    Heart sounds: Murmur heard.     Harsh midsystolic murmur is present with a grade of 3/6 at the upper right sternal border radiating to the neck.  Pulmonary:     Effort: Pulmonary effort is normal.     Breath sounds: Normal breath sounds. No wheezing or rales.  Musculoskeletal:     Right lower leg: No edema.     Left lower leg: No edema.            Visit diagnoses:   ICD-10-CM   1. Nonrheumatic aortic valve stenosis  I35.0 PCV ECHOCARDIOGRAM COMPLETE        Orders Placed This Encounter  Procedures   PCV ECHOCARDIOGRAM COMPLETE    Assessment & Recommendations:    57 y.o. Caucasian male with aortic stenosis  Aortic stenosis: Mild. No carotid stenosis.  Check echocardiogram and f/u in 1 year to ensure stability. After that, can check 18-24 months.   Thank you for referring the patient to [place]. Please feel free to contact with any questions.   Elder Negus, MD Pager: [phone] Office: [phone]

## 2023-05-15 ENCOUNTER — Telehealth: Payer: Self-pay | Admitting: *Deleted

## 2023-05-15 NOTE — Telephone Encounter (Signed)
Dr. Rosemary Holms, You recently saw this patient 04/05/23. We are now asked for cardiac clearance for colonoscopy. Can you please provide clearance?  We will route to the requesting party.

## 2023-05-15 NOTE — Telephone Encounter (Signed)
Low cardiac risk. Oky to proceed. Could you guide me regarding the stands format for preop clearance? I want to make sure I follow the correct procedure.  Thanks MJP

## 2023-05-15 NOTE — Telephone Encounter (Signed)
Pre-operative Risk Assessment    Patient Name: Bobby James  DOB: October 07, 1965 MRN: [id number]   LAST APPT:  04/05/23 NEXT APPT: 04/10/24   Request for Surgical Clearance    Procedure:   COLONOSCOPY  Date of Surgery:  Clearance 07/13/23                                 Surgeon:  DR. MANN Surgeon's Group or Practice Name:  [facility] Phone number:  [phone] Fax number:  [phone]   Type of Clearance Requested:   - Medical    Type of Anesthesia:   PROPOFOL   Additional requests/questions:    Signed, Elliot Cousin   05/15/2023, 11:11 AM

## 2023-05-16 NOTE — Telephone Encounter (Signed)
Patient Name: Bobby James  DOB: 1966-01-23 MRN: [id number]  Primary Cardiologist: Dr. Rosemary Holms  Chart reviewed as part of pre-operative protocol coverage. Per Dr. Rosemary Holms, he feels this patient is low cardiac risk and OK to proceed with colonoscopy as requested.   Will route this bundled recommendation to requesting provider via Epic fax function. Please call with questions.  Laurann Montana, PA-C 05/16/2023, 7:31 AM

## 2023-05-16 NOTE — Telephone Encounter (Signed)
Will route reply to Dr. Rosemary Holms in separate staff message.

## 2024-03-31 ENCOUNTER — Ambulatory Visit (HOSPITAL_COMMUNITY): Admission: RE | Admit: 2024-03-31 | Payer: Commercial Managed Care - HMO | Source: Ambulatory Visit

## 2024-03-31 ENCOUNTER — Other Ambulatory Visit: Payer: Commercial Managed Care - HMO

## 2024-03-31 ENCOUNTER — Encounter (HOSPITAL_COMMUNITY): Payer: Self-pay | Admitting: Cardiology

## 2024-04-10 ENCOUNTER — Ambulatory Visit: Payer: Self-pay | Admitting: Cardiology
